# Patient Record
Sex: Female | Born: 1940 | Race: White | Hispanic: No | Marital: Single | Smoking: Never smoker
Health system: Southern US, Community
[De-identification: ages and names within clinical notes are randomized; demographics above are authoritative.]

## PROBLEM LIST (undated history)

## (undated) DIAGNOSIS — R112 Nausea with vomiting, unspecified: Secondary | ICD-10-CM

## (undated) DIAGNOSIS — J45909 Unspecified asthma, uncomplicated: Secondary | ICD-10-CM

## (undated) DIAGNOSIS — F329 Major depressive disorder, single episode, unspecified: Secondary | ICD-10-CM

## (undated) DIAGNOSIS — I10 Essential (primary) hypertension: Secondary | ICD-10-CM

## (undated) DIAGNOSIS — Z9889 Other specified postprocedural states: Secondary | ICD-10-CM

## (undated) DIAGNOSIS — F32A Depression, unspecified: Secondary | ICD-10-CM

## (undated) DIAGNOSIS — E7800 Pure hypercholesterolemia, unspecified: Secondary | ICD-10-CM

## (undated) DIAGNOSIS — Z923 Personal history of irradiation: Secondary | ICD-10-CM

## (undated) DIAGNOSIS — K219 Gastro-esophageal reflux disease without esophagitis: Secondary | ICD-10-CM

## (undated) DIAGNOSIS — M1990 Unspecified osteoarthritis, unspecified site: Secondary | ICD-10-CM

## (undated) DIAGNOSIS — F419 Anxiety disorder, unspecified: Secondary | ICD-10-CM

## (undated) DIAGNOSIS — T8859XA Other complications of anesthesia, initial encounter: Secondary | ICD-10-CM

## (undated) DIAGNOSIS — T4145XA Adverse effect of unspecified anesthetic, initial encounter: Secondary | ICD-10-CM

## (undated) DIAGNOSIS — I517 Cardiomegaly: Secondary | ICD-10-CM

## (undated) HISTORY — PX: CHOLECYSTECTOMY: SHX55

## (undated) HISTORY — DX: Cardiomegaly: I51.7

## (undated) HISTORY — PX: BREAST LUMPECTOMY: SHX2

## (undated) HISTORY — PX: TONSILECTOMY/ADENOIDECTOMY WITH MYRINGOTOMY: SHX6125

## (undated) HISTORY — DX: Pure hypercholesterolemia, unspecified: E78.00

## (undated) HISTORY — DX: Personal history of irradiation: Z92.3

## (undated) HISTORY — PX: APPENDECTOMY: SHX54

## (undated) HISTORY — PX: PARTIAL HYSTERECTOMY: SHX80

## (undated) HISTORY — PX: TONSILLECTOMY: SUR1361

## (undated) HISTORY — PX: THYROIDECTOMY, PARTIAL: SHX18

## (undated) HISTORY — PX: BREAST SURGERY: SHX581

## (undated) HISTORY — PX: ABDOMINAL HYSTERECTOMY: SHX81

## (undated) HISTORY — PX: EYE SURGERY: SHX253

---

## 2004-04-07 ENCOUNTER — Ambulatory Visit: Payer: Self-pay | Admitting: Ophthalmology

## 2016-04-21 ENCOUNTER — Other Ambulatory Visit: Payer: Self-pay | Admitting: Family

## 2016-04-21 DIAGNOSIS — N63 Unspecified lump in unspecified breast: Secondary | ICD-10-CM

## 2016-04-22 ENCOUNTER — Other Ambulatory Visit: Payer: Self-pay | Admitting: Family

## 2016-04-22 DIAGNOSIS — N63 Unspecified lump in unspecified breast: Secondary | ICD-10-CM

## 2016-04-29 ENCOUNTER — Ambulatory Visit
Admission: RE | Admit: 2016-04-29 | Discharge: 2016-04-29 | Disposition: A | Discharge status: Home / Self Care | Payer: Medicare Other | Source: Ambulatory Visit | Attending: Family | Admitting: Family

## 2016-04-29 DIAGNOSIS — N63 Unspecified lump in unspecified breast: Secondary | ICD-10-CM

## 2016-05-03 ENCOUNTER — Telehealth: Payer: Self-pay | Admitting: *Deleted

## 2016-05-03 NOTE — Telephone Encounter (Signed)
Confirmed [facility] for 05/05/16 at 1215 .  Instructions and contact information given.

## 2016-05-04 ENCOUNTER — Other Ambulatory Visit: Payer: Self-pay | Admitting: *Deleted

## 2016-05-04 ENCOUNTER — Encounter: Payer: Self-pay | Admitting: *Deleted

## 2016-05-04 DIAGNOSIS — Z170 Estrogen receptor positive status [ER+]: Secondary | ICD-10-CM

## 2016-05-04 DIAGNOSIS — C50312 Malignant neoplasm of lower-inner quadrant of left female breast: Secondary | ICD-10-CM

## 2016-05-05 ENCOUNTER — Encounter: Payer: Self-pay | Admitting: Oncology

## 2016-05-05 ENCOUNTER — Ambulatory Visit: Payer: Medicare Other | Attending: General Surgery | Admitting: Physical Therapy

## 2016-05-05 ENCOUNTER — Encounter: Payer: Self-pay | Admitting: Physical Therapy

## 2016-05-05 ENCOUNTER — Other Ambulatory Visit (HOSPITAL_BASED_OUTPATIENT_CLINIC_OR_DEPARTMENT_OTHER): Payer: Medicare Other

## 2016-05-05 ENCOUNTER — Ambulatory Visit (HOSPITAL_BASED_OUTPATIENT_CLINIC_OR_DEPARTMENT_OTHER): Payer: Medicare Other | Admitting: Oncology

## 2016-05-05 ENCOUNTER — Ambulatory Visit: Payer: Self-pay | Admitting: General Surgery

## 2016-05-05 ENCOUNTER — Ambulatory Visit
Admission: RE | Admit: 2016-05-05 | Discharge: 2016-05-05 | Disposition: A | Discharge status: Home / Self Care | Payer: Medicare Other | Source: Ambulatory Visit | Attending: Radiation Oncology | Admitting: Radiation Oncology

## 2016-05-05 VITALS — BP 153/73 | HR 92 | Temp 97.9°F | Resp 17 | Ht 64.0 in | Wt 159.4 lb

## 2016-05-05 DIAGNOSIS — J45909 Unspecified asthma, uncomplicated: Secondary | ICD-10-CM | POA: Insufficient documentation

## 2016-05-05 DIAGNOSIS — I10 Essential (primary) hypertension: Secondary | ICD-10-CM | POA: Insufficient documentation

## 2016-05-05 DIAGNOSIS — Z79899 Other long term (current) drug therapy: Secondary | ICD-10-CM | POA: Insufficient documentation

## 2016-05-05 DIAGNOSIS — C50312 Malignant neoplasm of lower-inner quadrant of left female breast: Secondary | ICD-10-CM | POA: Diagnosis not present

## 2016-05-05 DIAGNOSIS — E7800 Pure hypercholesterolemia, unspecified: Secondary | ICD-10-CM | POA: Insufficient documentation

## 2016-05-05 DIAGNOSIS — Z170 Estrogen receptor positive status [ER+]: Secondary | ICD-10-CM

## 2016-05-05 DIAGNOSIS — F329 Major depressive disorder, single episode, unspecified: Secondary | ICD-10-CM | POA: Insufficient documentation

## 2016-05-05 DIAGNOSIS — C50512 Malignant neoplasm of lower-outer quadrant of left female breast: Secondary | ICD-10-CM

## 2016-05-05 DIAGNOSIS — F419 Anxiety disorder, unspecified: Secondary | ICD-10-CM | POA: Insufficient documentation

## 2016-05-05 DIAGNOSIS — R293 Abnormal posture: Secondary | ICD-10-CM | POA: Diagnosis present

## 2016-05-05 DIAGNOSIS — Z803 Family history of malignant neoplasm of breast: Secondary | ICD-10-CM | POA: Insufficient documentation

## 2016-05-05 DIAGNOSIS — K219 Gastro-esophageal reflux disease without esophagitis: Secondary | ICD-10-CM | POA: Insufficient documentation

## 2016-05-05 LAB — COMPREHENSIVE METABOLIC PANEL
ALT: 29 U/L (ref 0–55)
ANION GAP: 8 meq/L (ref 3–11)
AST: 22 U/L (ref 5–34)
Albumin: 4 g/dL (ref 3.5–5.0)
Alkaline Phosphatase: 75 U/L (ref 40–150)
BILIRUBIN TOTAL: 0.29 mg/dL (ref 0.20–1.20)
BUN: 16.4 mg/dL (ref 7.0–26.0)
CHLORIDE: 107 meq/L (ref 98–109)
CO2: 27 meq/L (ref 22–29)
Calcium: 9.5 mg/dL (ref 8.4–10.4)
Creatinine: 1 mg/dL (ref 0.6–1.1)
EGFR: 58 mL/min/{1.73_m2} — AB (ref >90)
Glucose: 123 mg/dl (ref 70–140)
POTASSIUM: 4 meq/L (ref 3.5–5.1)
Sodium: 142 mEq/L (ref 136–145)
Total Protein: 7.1 g/dL (ref 6.4–8.3)

## 2016-05-05 LAB — CBC WITH DIFFERENTIAL/PLATELET
BASO%: 0.4 % (ref 0.0–2.0)
Basophils Absolute: 0 10*3/uL (ref 0.0–0.1)
EOS ABS: 0.1 10*3/uL (ref 0.0–0.5)
EOS%: 1.6 % (ref 0.0–7.0)
HEMATOCRIT: 38.2 % (ref 34.8–46.6)
HEMOGLOBIN: 12.9 g/dL (ref 11.6–15.9)
LYMPH#: 2.5 10*3/uL (ref 0.9–3.3)
LYMPH%: 36 % (ref 14.0–49.7)
MCH: 31.3 pg (ref 25.1–34.0)
MCHC: 33.7 g/dL (ref 31.5–36.0)
MCV: 93 fL (ref 79.5–101.0)
MONO#: 0.6 10*3/uL (ref 0.1–0.9)
MONO%: 8.5 % (ref 0.0–14.0)
NEUT%: 53.5 % (ref 38.4–76.8)
NEUTROS ABS: 3.7 10*3/uL (ref 1.5–6.5)
PLATELETS: 169 10*3/uL (ref 145–400)
RBC: 4.11 10*6/uL (ref 3.70–5.45)
RDW: 13.3 % (ref 11.2–14.5)
WBC: 7 10*3/uL (ref 3.9–10.3)

## 2016-05-05 MED ORDER — TAMOXIFEN CITRATE 20 MG PO TABS: 20.0000 mg | ORAL_TABLET | Freq: Every day | ORAL | 12 refills | Status: DC

## 2016-05-05 NOTE — Patient Instructions (Signed)

## 2016-05-05 NOTE — Progress Notes (Signed)
[facility]ne Thompson  [phone] [phone]) [phone]     ID: Jodine Muchmore DOB: Oct 28, 1940  MR#: [id number]  OFH#:[id number]  Patient Care Team: Gennette Pac, NP as PCP - General (Internal Medicine) Chauncey Cruel, MD as Consulting Physician (Oncology) Autumn Messing III, MD as Consulting Physician (General Surgery) Gery Pray, MD as Consulting Physician (Radiation Oncology) Chauncey Cruel, MD OTHER MD:  CHIEF COMPLAINT: Estrogen receptor positive breast cancer  CURRENT TREATMENT: Awaiting definitive therapy   BREAST CANCER HISTORY: Taqwa had routine screening mammography 04/05/2016 showing a possible mass in the left breast. She was brought back for left diagnostic mammography and left breast ultrasonography 04/20/2016. The breast density was category C. There was a spiculated mass in the lower inner portion of the left breast. On physical exam this was not palpable. Targeted ultrasound did confirm an irregular mass at the 6:15 o'clock location of the left breast 3 cm from the nipple, measuring 1.7 cm maximally.  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  Her subsequent history is as detailed below.  INTERVAL HISTORY: Gene was evaluated in the multidisciplinary breast cancer clinic accompanied by her daughter in law Nira Conn and her friend Stanton Kidney. Her case was also presented in the multidisciplinary breast cancer conference that same morning. At that time a preliminary plan was proposed: Breast conserving surgery with sentinel lymph node sampling, consideration of Oncotype, consideration of genetics, and if the patient agrees to anti-estrogen therapy, possibly no adjuvant radiation.  REVIEW OF  SYSTEMS: The patient complains of mild to moderate fatigue, which is not a new problem. She has pain in the back and abdomen and some leg cramps. His sinus problems. She is hoarse. She has a history of irregular heartbeat which has been evaluated by cardiology with stress test, which was negative. She has ankle swelling at times. She has chest wall pain at time, which is not new or more intense or persistent than before. She keeps a dry cough this time of year. She says her appetite is poor and she has a history of ulcers. Sometimes she has bright red blood per rectum which she attributes to hemorrhoids. He has stress urinary incontinence. She bruises easily. She feels forgetful but not anxious or depressed. A detailed review of systems today was otherwise stable.  PAST MEDICAL HISTORY: Past Medical History:  Diagnosis Date  . Enlarged heart   . High cholesterol     PAST SURGICAL HISTORY: Past Surgical History:  Procedure Laterality Date  . APPENDECTOMY    . CHOLECYSTECTOMY    . PARTIAL HYSTERECTOMY    . THYROIDECTOMY, PARTIAL    . TONSILECTOMY/ADENOIDECTOMY WITH MYRINGOTOMY      FAMILY HISTORY Family History  Problem Relation Age of Onset  . Breast cancer Mother   . Colon cancer Brother   . Breast cancer Maternal Grandfather   . Breast cancer Paternal Grandmother   The patient's father died from myocardial infarction at age 35 in the setting of tobacco abuse. The patient's mother died at age 51. She had been diagnosed with breast cancer at age 46 and again at age 36. The patient had one brother, no sisters. The brother had colon cancer diagnosed at age 75. A maternal grandmother was diagnosed with breast cancer in her 52s.  A paternal grandmother was diagnosed with breast cancer as well as well as 2 paternal aunts and in addition one paternal cousin was diagnosed with ovarian cancer. The patient herself has been tested for the BRCA mutation in was negative  GYNECOLOGIC HISTORY:  No LMP  recorded. Menarche age 29, first live birth age 68, she is [place] P2. She underwent hysterectomy with unilateral salpingo-oophorectomy age 17. She did not use hormone replacement. She did use oral contraceptives remotely for about 4 years.  SOCIAL HISTORY:  She worked as a Pharmacist, hospital, chiefly in reading, and still works part-time. She is divorced, lives alone with her dog appendectomy. Son today what cans lives in [place] and works in Press photographer. Son Sachi Boulay lives in [place] and is vice Radio producer and service. The patient has 4 grandchildren by a logically and 2 step grandchildren. She is a [place]    ADVANCED DIRECTIVES: She has the advanced directive papers but has not yet completed or notarize them, as of the 05/05/2016 visit   HEALTH MAINTENANCE: Social History  Substance Use Topics  . Smoking status: Never Smoker  . Smokeless tobacco: Not on file  . Alcohol use No     Colonoscopy:  PAP:  Bone density:   Not on File  Current Outpatient Prescriptions  Medication Sig Dispense Refill  . albuterol (PROVENTIL HFA;VENTOLIN HFA) 108 (90 Base) MCG/ACT inhaler Inhale 2 puffs into the lungs every 4 (four) hours as needed for wheezing or shortness of breath.    Marland Kitchen alendronate-cholecalciferol (FOSAMAX PLUS D) 70-5600 MG-UNIT tablet Take 1 tablet by mouth every 7 (seven) days. Take with a full glass of water on an empty stomach.    Marland Kitchen atorvastatin (LIPITOR) 10 MG tablet Take 10 mg by mouth daily.    . clobetasol ointment (TEMOVATE) 2.69 % Apply 1 application topically 2 (two) times daily.    . enalapril (VASOTEC) 2.5 MG tablet Take 2.5 mg by mouth daily.    Marland Kitchen esomeprazole (NEXIUM) 40 MG capsule Take 40 mg by mouth daily at 12 noon.    . estrogens, conjugated, (PREMARIN) 0.625 MG tablet Take 0.625 mg by mouth 2 (two) times a week. Take daily for 21 days then do not take for 7 days.    Marland Kitchen sertraline (ZOLOFT) 100 MG tablet Take 100 mg by mouth daily.     No current  facility-administered medications for this visit.     OBJECTIVE: Older white woman who appears stated age 76:   05/05/16 1308  BP: (!) 153/73  Pulse: 92  Resp: 17  Temp: 97.9 F (36.6 C)     Body mass index is 27.36 kg/m.    ECOG FS:1 - Symptomatic but completely ambulatory  Ocular: Sclerae unicteric, pupils equal, round and reactive to light Ear-nose-throat: Oropharynx clear and moist Lymphatic: No cervical or supraclavicular adenopathy Lungs no rales or rhonchi, good excursion bilaterally Heart regular rate and rhythm, no murmur appreciated Abd soft, nontender, positive bowel sounds MSK no focal spinal tenderness, no joint edema Neuro: non-focal, well-oriented, appropriate affect Breasts: The right breast is unremarkable. The left breast is status post recent biopsy. There is a moderate ecchymosis. I do not palpate a well-defined mass. Both axillae are benign.   LAB RESULTS:  CMP     Component Value Date/Time   NA 142 05/05/2016 1208   K 4.0 05/05/2016 1208   CO2 27 05/05/2016 1208   GLUCOSE 123 05/05/2016 1208   BUN 16.4 05/05/2016 1208   CREATININE 1.0 05/05/2016 1208  CALCIUM 9.5 05/05/2016 1208   PROT 7.1 05/05/2016 1208   ALBUMIN 4.0 05/05/2016 1208   AST 22 05/05/2016 1208   ALT 29 05/05/2016 1208   ALKPHOS 75 05/05/2016 1208   BILITOT 0.29 05/05/2016 1208    No results found for: TOTALPROTELP, ALBUMINELP, A1GS, A2GS, BETS, BETA2SER, GAMS, MSPIKE, SPEI  No results found for: Nils Pyle, [facility]  Lab Results  Component Value Date   WBC 7.0 05/05/2016   NEUTROABS 3.7 05/05/2016   HGB 12.9 05/05/2016   HCT 38.2 05/05/2016   MCV 93.0 05/05/2016   PLT 169 05/05/2016      Chemistry      Component Value Date/Time   NA 142 05/05/2016 1208   K 4.0 05/05/2016 1208   CO2 27 05/05/2016 1208   BUN 16.4 05/05/2016 1208   CREATININE 1.0 05/05/2016 1208      Component Value Date/Time   CALCIUM 9.5 05/05/2016 1208   ALKPHOS 75  05/05/2016 1208   AST 22 05/05/2016 1208   ALT 29 05/05/2016 1208   BILITOT 0.29 05/05/2016 1208       No results found for: LABCA2  No components found for: [id number]  No results for input(s): INR in the last 168 hours.  Urinalysis No results found for: COLORURINE, APPEARANCEUR, LABSPEC, PHURINE, GLUCOSEU, HGBUR, BILIRUBINUR, KETONESUR, PROTEINUR, UROBILINOGEN, NITRITE, LEUKOCYTESUR   STUDIES: Mm Clip Placement Left  Result Date: 04/29/2016 CLINICAL DATA:  Status post ultrasound-guided core biopsy of a left breast mass at the 6:30 o'clock axis EXAM: DIAGNOSTIC LEFT MAMMOGRAM POST ULTRASOUND BIOPSY COMPARISON:  Previous exam(s). FINDINGS: Mammographic images were obtained following ultrasound guided biopsy of the irregular mass within the left breast at the 6:30 o'clock axis. At the conclusion of the procedure, a ribbon shaped tissue marker was placed at the biopsy site. Biopsy clip is appropriately positioned at the posterior margin of the targeted mass. IMPRESSION: Ribbon shaped biopsy clip appropriately positioned at the posterior margin of the targeted right breast mass at the 6:30 o'clock axis. Final Assessment: Post Procedure Mammograms for Marker Placement Electronically Signed   By: Franki Cabot M.D.   On: 04/29/2016 13:35   [place] Lt Breast Bx W Loc Dev 1st Lesion Img Bx Spec US Guide  Addendum Date: 04/30/2016   ADDENDUM REPORT: 04/30/2016 13:56 ADDENDUM: Pathology revealed grade II invasive ductal carcinoma, ductal carcinoma in situ with microcalcifications and a papillary lesion in the left breast. This was found to be concordant by Dr. Franki Cabot. Pathology results were discussed with the patient by telephone. The patient reported doing well after the biopsy with tenderness at the site. Post biopsy instructions and care were reviewed and questions were answered. The patient was encouraged to call [facility] for any additional concerns. The patient was  referred to the [facility] at the [facility] on May 05, 2016. Pathology results reported by Susa Raring RN, BSN on 04/30/2016. Electronically Signed   By: Franki Cabot M.D.   On: 04/30/2016 13:56   Result Date: 04/30/2016 CLINICAL DATA:  Patient with a left breast mass presents today for ultrasound-guided core biopsy. EXAM: ULTRASOUND GUIDED LEFT BREAST CORE NEEDLE BIOPSY COMPARISON:  Previous exam(s). PROCEDURE: I met with the patient and we discussed the procedure of ultrasound-guided biopsy, including benefits and alternatives. We discussed the high likelihood of a successful procedure. We discussed the risks of the procedure including infection, bleeding, tissue injury, clip migration, and inadequate sampling. Informed written consent was given.  The usual time-out protocol was performed immediately prior to the procedure. Lesion quadrant: Lower inner quadrant Using sterile technique and 1% Lidocaine as local anesthetic, under direct ultrasound visualization, a 12 gauge spring-loaded device was used to perform biopsy of the irregular mass in the left breast at the 6:30 o'clock axisusing a lateral approach. At the conclusion of the procedure, a ribbon shaped tissue marker clip was deployed into the biopsy cavity. Follow-up 2-view mammogram was performed and dictated separately. IMPRESSION: Ultrasound-guided biopsy of the left breast mass at the 6:30 o'clock axis. No apparent complications. Electronically Signed: By: Franki Cabot M.D. On: 04/29/2016 13:33    ELIGIBLE FOR AVAILABLE RESEARCH PROTOCOL: no  ASSESSMENT: 76 y.o. BRCA negative [facility] woman status post left breast lower inner quadrant biopsy 04/29/2016 for a clinical pT1c pN0, tage IA s invasive ductal carcinoma,  grade 2, estrogen and progesterone receptor positive, HER-2 nonamplified, with an MIB-1 of 10%.   (1) tamoxifen started neoadjuvantly 05/05/2016  (2) additional genetics testing  pending  (3) definitive surgery to follow depending on genetics test  (4) Oncotype to be obtained from the definitive surgical samples: Chemotherapy not anticipated  (5) adjuvant radiation to follow as appropriate  PLAN: We spent the better part of today's hour-long appointment discussing the biology of breast cancer in general, and the specifics of the patient's tumor in particular. We first reviewed the fact that cancer is not one disease but more than 100 different diseases and that it is important to keep them separate-- otherwise when friends and relatives discuss their own cancer experiences with Jari confusion can result. Similarly we explained that if breast cancer spreads to the bone or liver, the patient would not have bone cancer or liver cancer, but breast cancer in the bone and breast cancer in the liver: one cancer in three places-- not 3 different cancers which otherwise would have to be treated in 3 different ways.  We discussed the difference between local and systemic therapy. In terms of loco-regional treatment, lumpectomy plus radiation is equivalent to mastectomy as far as survival is concerned. For this reason, and because the cosmetic results are generally superior, we recommend breast conserving surgery.   We also noted that in terms of sequencing of treatments, whether systemic therapy or surgery is done first does not affect the ultimate outcome. This is relevant to Elfida's case, since there may be some delay in her definitive surgery while we wait on genetics results and her final surgical decision. She will be protected during this delay by starting antiestrogen's now.   We then discussed the rationale for systemic therapy. There is some risk that this cancer may have already spread to other parts of her body. Patients frequently ask at this point about bone scans, CAT scans and PET scans to find out if they have occult breast cancer somewhere else. The problem is that in  early stage disease we are much more likely to find false positives then true cancers and this would expose the patient to unnecessary procedures as well as unnecessary radiation. Scans cannot answer the question the patient really would like to know, which is whether she has microscopic disease elsewhere in her body. For those reasons we do not recommend them.  Of course we would proceed to aggressive evaluation of any symptoms that might suggest metastatic disease, but that is not the case here.  Next we went over the options for systemic therapy which are anti-estrogens, anti-HER-2 immunotherapy, and chemotherapy. Kasen does not  meet criteria for anti-HER-2 immunotherapy. She is a good candidate for anti-estrogens.  The question of chemotherapy is more complicated. Chemotherapy is most effective in rapidly growing, aggressive tumors. It is much less effective in low-grade, slow growing cancers, like Reneisha 's. For that reason we are going to request an Oncotype from the definitive surgical sample, as suggested by NCCN guidelines. That will help [place] make a definitive decision regarding chemotherapy in this case.  Ayda has been tested for the BRCA1 and 2 genes and she does not carry those mutations, but this was an while back and we are doing broader panels now. She will be retested. If she does carry a deleterious mutation she has not entirely ruled out the possibility of bilateral mastectomies although she understands that intensified screening yields similar survival results.  For that reason it will be prudent to start her on anti-estrogens now. We discussed the difference between anastrozole and tamoxifen and because she very much wants to continue using vaginal estrogen cream, and because she status post hysterectomy, I think she would be a good candidate for tamoxifen. She has a good understanding of the possible toxicities, side effects and complete locations of this agent including the risk of blood  clots. She will start that today. She can then proceed to genetics testing on eventual surgery at leisure.   Aireanna has a good understanding of the overall plan. She agrees with it. She knows the goal of treatment in her case is cure. She will call with any problems that may develop before her next visit here.  Chauncey Cruel, MD   05/05/2016 2:31 PM Medical Oncology and Hematology [facility] [address] [place], [place] [address] Tel. [phone]    Fax. [phone]

## 2016-05-05 NOTE — Therapy (Signed)
[place], [place], [address] Phone: [phone]   Fax:  [phone]  Physical Therapy Evaluation  Patient Details  Name: Jeanne Thompson MRN: [id number] Date of Birth: November 13, 1940 Referring Provider: Dr. Autumn Messing  Encounter Date: 05/05/2016      PT End of Session - 05/05/16 1630    Visit Number 1   Number of Visits 1   PT Start Time 1312   PT Stop Time 1349   PT Time Calculation (min) 37 min   Activity Tolerance Patient tolerated treatment well   Behavior During Therapy [facility] for tasks assessed/performed      Past Medical History:  Diagnosis Date  . Enlarged heart   . High cholesterol     Past Surgical History:  Procedure Laterality Date  . APPENDECTOMY    . CHOLECYSTECTOMY    . PARTIAL HYSTERECTOMY    . THYROIDECTOMY, PARTIAL    . TONSILECTOMY/ADENOIDECTOMY WITH MYRINGOTOMY      There were no vitals filed for this visit.       Subjective Assessment - 05/05/16 1623    Subjective Patient reports she is here to be seen by her medical team for her newly diagnosed left breast cancer.   Patient is accompained by: Family member   Pertinent History Patient was diagnosed on 04/05/16 with left invasive ductal carcinoma breast cancer. It measures 1.7 cm and is located in the lower inner quadrant. It is ER/PR positive and HER2 negative with a Ki67 of 10%.    Patient Stated Goals reduce lymphedema risk and learn post op shoulder ROM HEP   Currently in Pain? No/denies            [facility] PT Assessment - 05/05/16 0001      Assessment   Medical Diagnosis Left breast cancer   Referring Provider Dr. Autumn Messing   Onset Date/Surgical Date 04/05/16   Hand Dominance Right   Prior Therapy none     Precautions   Precautions Other (comment)   Precaution Comments active cancer     Restrictions   Weight Bearing Restrictions No     Balance Screen   Has the patient fallen in the past 6 months Yes   Encouraged pt to seek PT near her home for balance   How many times? 3  She reports toe numbness and unsteady gait x6 months   Has the patient had a decrease in activity level because of a fear of falling?  No   Is the patient reluctant to leave their home because of a fear of falling?  No     Home Environment   Living Environment Private residence   Living Arrangements Spouse/significant other  59 y.o. 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   Leisure She does not exercise     Cognition   Overall Cognitive Status Within Functional Limits for tasks assessed     Posture/Postural Control   Posture/Postural Control Postural limitations   Postural Limitations Rounded Shoulders;Forward head;Increased thoracic kyphosis     ROM / Strength   AROM / PROM / Strength AROM;Strength     AROM   AROM Assessment Site Shoulder;Cervical   Right/Left Shoulder Right;Left   Right Shoulder Extension 60 Degrees   Right Shoulder Flexion 152 Degrees   Right Shoulder ABduction 170 Degrees  Right Shoulder Internal Rotation 68 Degrees   Right Shoulder External Rotation 77 Degrees   Left Shoulder Extension 63 Degrees   Left Shoulder Flexion 134 Degrees   Left Shoulder ABduction 159 Degrees   Left Shoulder Internal Rotation 80 Degrees   Left Shoulder External Rotation 70 Degrees   Cervical Flexion WNL   Cervical Extension WNL   Cervical - Right Side Bend WNL   Cervical - Left Side Bend WNL   Cervical - Right Rotation WNL   Cervical - Left Rotation WNL     Strength   Overall Strength Within functional limits for tasks performed           LYMPHEDEMA/ONCOLOGY QUESTIONNAIRE - 05/05/16 1628      Type   Cancer Type Left breast cancer     Lymphedema Assessments   Lymphedema Assessments Upper extremities     Right Upper Extremity  Lymphedema   10 cm Proximal to Olecranon Process 27.9 cm   Olecranon Process 24.4 cm   10 cm Proximal to Ulnar Styloid Process 23.1 cm   Just Proximal to Ulnar Styloid Process 16.3 cm   Across Hand at [employer] 18.8 cm   At [place] of 2nd Digit 5.9 cm     Left Upper Extremity Lymphedema   10 cm Proximal to Olecranon Process 27.8 cm   Olecranon Process 25.7 cm   10 cm Proximal to Ulnar Styloid Process 22.3 cm   Just Proximal to Ulnar Styloid Process 16.1 cm   Across Hand at [employer] 18 cm   At [place] of 2nd Digit 5.7 cm       Patient was instructed today in a home exercise program today for post op shoulder range of motion. These included active assist shoulder flexion in sitting, scapular retraction, wall walking with shoulder abduction, and hands behind head external rotation.  She was encouraged to do these twice a day, holding 3 seconds and repeating 5 times when permitted by her physician.          PT Education - 05/05/16 1630    Education provided Yes   Education Details Lymphedema risk reduction and post op shoulder ROM HEP   Person(s) Educated Patient   Methods Explanation;Demonstration;Handout   Comprehension Returned demonstration;Verbalized understanding              Breast Clinic Goals - 05/05/16 1633      Patient will be able to verbalize understanding of pertinent lymphedema risk reduction practices relevant to her diagnosis specifically related to skin care.   Time 1   Period Days   Status Achieved     Patient will be able to return demonstrate and/or verbalize understanding of the post-op home exercise program related to regaining shoulder range of motion.   Time 1   Period Days   Status Achieved     Patient will be able to verbalize understanding of the importance of attending the postoperative After Breast Cancer Class for further lymphedema risk reduction education and therapeutic exercise.   Time 1   Period Days   Status Achieved               Plan - 05/05/16 1630    Clinical Impression Statement Patient was diagnosed on 04/05/16 with left invasive ductal carcinoma breast cancer. It measures 1.7 cm and is located in the lower inner quadrant. It is ER/PR positive and HER2 negative with a Ki67 of 10%. Her multidisciplinary medical team met prior to her assessments to determine  a recommended treatment plan. She is planning to have a left lumpectomy and sentinel node biopsy followed by radiation and anti-estrogen therapy. She may benefit from post op PT to regain shoulder ROM and reduce lymphedema risk. Due to her lack of comorbidities her eval is of low complexity.   Rehab Potential Excellent   Clinical Impairments Affecting Rehab Potential None   PT Frequency One time visit   PT Treatment/Interventions Patient/family education;Therapeutic exercise   PT Next Visit Plan Will f/u after surgery to determine PT needs   PT Home Exercise Plan Post op shoulder ROM HEP   Consulted and Agree with Plan of Care Patient;Family member/caregiver   Family Member Consulted Daughter and friend      Patient will benefit from skilled therapeutic intervention in order to improve the following deficits and impairments:  Postural dysfunction, Decreased knowledge of precautions, Pain, Impaired UE functional use, Decreased range of motion  Visit Diagnosis: Carcinoma of lower-inner quadrant of left breast in female, estrogen receptor positive ([place]) - Plan: PT plan of care cert/re-cert  Abnormal posture - Plan: PT plan of care cert/re-cert  Patient will follow up at outpatient cancer rehab if needed following surgery.  If the patient requires physical therapy at that time, a specific plan will be dictated and sent to the referring physician for approval. The patient was educated today on appropriate basic range of motion exercises to begin post operatively and the importance of attending the After Breast Cancer class following surgery.  Patient  was educated today on lymphedema risk reduction practices as it pertains to recommendations that will benefit the patient immediately following surgery.  She verbalized good understanding.  No additional physical therapy is indicated at this time.        G-Codes - 05-30-16 1633    Functional Assessment Tool Used (Outpatient Only) Clinical Judgement   Functional Limitation Other PT primary   Other PT Primary Current Status (F7588) At least 1 percent but less than 20 percent impaired, limited or restricted   Other PT Primary Goal Status (T2549) At least 1 percent but less than 20 percent impaired, limited or restricted   Other PT Primary Discharge Status (I2641) At least 1 percent but less than 20 percent impaired, limited or restricted       Problem List Patient Active Problem List   Diagnosis Date Noted  . Malignant neoplasm of lower-inner quadrant of left breast in female, estrogen receptor positive ([place]) 05/04/2016    Annia Friendly, PT 30-May-2016 4:35 PM  [place], [place], [address] Phone: [phone]   Fax:  [phone]  Name: Jeanne Thompson MRN: [id number] Date of Birth: 09-Mar-1940

## 2016-05-05 NOTE — Progress Notes (Signed)
Radiation Oncology         [phone] ________________________________  Initial Outpatient Consultation  Name: Jeanne Thompson MRN: [id number]  Date: 05/05/2016  DOB: August 08, 1940  NW:GNFAOZH P Carlis Abbott, NP  Jovita Kussmaul, MD   REFERRING PHYSICIAN: Autumn Messing III, MD  DIAGNOSIS:    ICD-9-CM ICD-10-CM   1. Malignant neoplasm of lower-inner quadrant of left breast in female, estrogen receptor positive ([place]) 174.3 C50.312    V86.0 Z17.0    Stage IA (cT1c, cN0, cM0) Left Breast LIQ Invasive Ductal Carcinoma, ER+ / PR+ / Her2-, Grade 2  CHIEF COMPLAINT: Here to discuss management of left breast cancer  HISTORY OF PRESENT ILLNESS::Jeanne Thompson is a 76 y.o. female who presented with left breast mass on screening detected mammogram. Diagnostic mammogram on 04/20/16 showed a 1.7 x 1.1 x 1.6 cm mass 3 cm from the nipple at the 6:15 position. There were two circumscribed nodules at the 5:45 position that are stable and consistent with benign fibroadenomas. Axilla was negative. Biopsy on 04/29/16 showed invasive ductal carcinoma with microcalcifications with characteristics as described above in the diagnosis. Receptor status was ER 100%, PR 100%, HER2-, and Ki67 10%.  Of note, patient has a family history of breast cancer. Her mother was diagnosed at age 39, she also reports her maternal and paternal grandmothers as well as an aunt were diagnosed with breast cancer.  Patient is positive for fatigue, general pain, sinus problems, hoarse voice, irregular heartbeat, feet swelling, chest pain, dry cough, ulcer, hernia, change in stool habits, change in stool color, abdominal pain, rectal bleeding, dribbling urine, pain in breast, lump in breast, breast dimpling, bruise easily, back pain, arthritis, difficulty walking, weakness, numbness, and forgetfulness.  PREVIOUS RADIATION THERAPY: No  PAST MEDICAL HISTORY:  has a past medical history of Enlarged heart and High cholesterol.    PAST SURGICAL  HISTORY: Past Surgical History:  Procedure Laterality Date  . APPENDECTOMY    . CHOLECYSTECTOMY    . PARTIAL HYSTERECTOMY    . THYROIDECTOMY, PARTIAL    . TONSILECTOMY/ADENOIDECTOMY WITH MYRINGOTOMY      FAMILY HISTORY: family history includes Breast cancer in her maternal grandfather, mother, and paternal grandmother; Colon cancer in her brother.  SOCIAL HISTORY:  reports that she has never smoked. She does not have any smokeless tobacco history on file. She reports that she does not drink alcohol or use drugs.  ALLERGIES: Patient has no allergy information on record.  MEDICATIONS:  Current Outpatient Prescriptions  Medication Sig Dispense Refill  . albuterol (PROVENTIL HFA;VENTOLIN HFA) 108 (90 Base) MCG/ACT inhaler Inhale 2 puffs into the lungs every 4 (four) hours as needed for wheezing or shortness of breath.    Marland Kitchen alendronate-cholecalciferol (FOSAMAX PLUS D) 70-5600 MG-UNIT tablet Take 1 tablet by mouth every 7 (seven) days. Take with a full glass of water on an empty stomach.    Marland Kitchen atorvastatin (LIPITOR) 10 MG tablet Take 10 mg by mouth daily.    . clobetasol ointment (TEMOVATE) 0.86 % Apply 1 application topically 2 (two) times daily.    . enalapril (VASOTEC) 2.5 MG tablet Take 2.5 mg by mouth daily.    Marland Kitchen esomeprazole (NEXIUM) 40 MG capsule Take 40 mg by mouth daily at 12 noon.    . estrogens, conjugated, (PREMARIN) 0.625 MG tablet Take 0.625 mg by mouth 2 (two) times a week. Take daily for 21 days then do not take for 7 days.    Marland Kitchen sertraline (ZOLOFT) 100 MG tablet Take 100 mg by  mouth daily.    . tamoxifen (NOLVADEX) 20 MG tablet Take 1 tablet (20 mg total) by mouth daily. 90 tablet 12   No current facility-administered medications for this encounter.     REVIEW OF SYSTEMS: A 10+ POINT REVIEW OF SYSTEMS WAS OBTAINED including neurology, dermatology, psychiatry, cardiac, respiratory, lymph, extremities, GI, GU, Musculoskeletal, constitutional, breasts, reproductive, HEENT.  All  pertinent positives are noted in the HPI.  All others are negative.   PHYSICAL EXAM:  Vitals with BMI 05/05/2016  Height '5\' 4"'   Weight 159 lbs 6 oz  BMI 15.4  Systolic 008  Diastolic 73  Pulse 92  Respirations 17   General: Alert and oriented, in no acute distress HEENT: Head is normocephalic. Extraocular movements are intact. Oropharynx is clear. Neck: Neck is supple, no palpable cervical or supraclavicular lymphadenopathy. Heart: Regular in rate and rhythm with no murmurs, rubs, or gallops. Chest: Clear to auscultation bilaterally, with no rhonchi, wheezes, or rales. Abdomen: Soft, nontender, nondistended, with no rigidity or guarding. Extremities: No cyanosis or edema. Lymphatics: see Neck Exam Skin: No concerning lesions. Musculoskeletal: symmetric strength and muscle tone throughout. Neurologic: Cranial nerves II through XII are grossly intact. No obvious focalities. Speech is fluent. Coordination is intact. Psychiatric: Judgment and insight are intact. Affect is appropriate. Breasts: Left breast the patient has some bruising in the inferior aspect of the breast. No palpable masses appreciated in the breasts or axillae. No nipple discharge, or bleeding in bilateral breasts.    ECOG = 1  0 - Asymptomatic (Fully active, able to carry on all predisease activities without restriction)  1 - Symptomatic but completely ambulatory (Restricted in physically strenuous activity but ambulatory and able to carry out work of a light or sedentary nature. For example, light housework, office work)  2 - Symptomatic, <50% in bed during the day (Ambulatory and capable of all self care but unable to carry out any work activities. Up and about more than 50% of waking hours)  3 - Symptomatic, >50% in bed, but not bedbound (Capable of only limited self-care, confined to bed or chair 50% or more of waking hours)  4 - Bedbound (Completely disabled. Cannot carry on any self-care. Totally confined to  bed or chair)  5 - Death   Eustace Pen MM, Creech RH, Tormey DC, et al. [phone]). "Toxicity and response criteria of the [facility]". [place] Oncol. 5 (6): 649-55   LABORATORY DATA:  Lab Results  Component Value Date   WBC 7.0 05/05/2016   HGB 12.9 05/05/2016   HCT 38.2 05/05/2016   MCV 93.0 05/05/2016   PLT 169 05/05/2016   CMP     Component Value Date/Time   NA 142 05/05/2016 1208   K 4.0 05/05/2016 1208   CO2 27 05/05/2016 1208   GLUCOSE 123 05/05/2016 1208   BUN 16.4 05/05/2016 1208   CREATININE 1.0 05/05/2016 1208   CALCIUM 9.5 05/05/2016 1208   PROT 7.1 05/05/2016 1208   ALBUMIN 4.0 05/05/2016 1208   AST 22 05/05/2016 1208   ALT 29 05/05/2016 1208   ALKPHOS 75 05/05/2016 1208   BILITOT 0.29 05/05/2016 1208         RADIOGRAPHY: Mm Clip Placement Left  Result Date: 04/29/2016 CLINICAL DATA:  Status post ultrasound-guided core biopsy of a left breast mass at the 6:30 o'clock axis EXAM: DIAGNOSTIC LEFT MAMMOGRAM POST ULTRASOUND BIOPSY COMPARISON:  Previous exam(s). FINDINGS: Mammographic images were obtained following ultrasound guided biopsy of the irregular mass within  the left breast at the 6:30 o'clock axis. At the conclusion of the procedure, a ribbon shaped tissue marker was placed at the biopsy site. Biopsy clip is appropriately positioned at the posterior margin of the targeted mass. IMPRESSION: Ribbon shaped biopsy clip appropriately positioned at the posterior margin of the targeted right breast mass at the 6:30 o'clock axis. Final Assessment: Post Procedure Mammograms for Marker Placement Electronically Signed   By: Franki Cabot M.D.   On: 04/29/2016 13:35   [place] Lt Breast Bx W Loc Dev 1st Lesion Img Bx Spec US Guide  Addendum Date: 04/30/2016   ADDENDUM REPORT: 04/30/2016 13:56 ADDENDUM: Pathology revealed grade II invasive ductal carcinoma, ductal carcinoma in situ with microcalcifications and a papillary lesion in the left breast. This was  found to be concordant by Dr. Franki Cabot. Pathology results were discussed with the patient by telephone. The patient reported doing well after the biopsy with tenderness at the site. Post biopsy instructions and care were reviewed and questions were answered. The patient was encouraged to call [facility] for any additional concerns. The patient was referred to the [facility] at the [facility] on May 05, 2016. Pathology results reported by Susa Raring RN, BSN on 04/30/2016. Electronically Signed   By: Franki Cabot M.D.   On: 04/30/2016 13:56   Result Date: 04/30/2016 CLINICAL DATA:  Patient with a left breast mass presents today for ultrasound-guided core biopsy. EXAM: ULTRASOUND GUIDED LEFT BREAST CORE NEEDLE BIOPSY COMPARISON:  Previous exam(s). PROCEDURE: I met with the patient and we discussed the procedure of ultrasound-guided biopsy, including benefits and alternatives. We discussed the high likelihood of a successful procedure. We discussed the risks of the procedure including infection, bleeding, tissue injury, clip migration, and inadequate sampling. Informed written consent was given. The usual time-out protocol was performed immediately prior to the procedure. Lesion quadrant: Lower inner quadrant Using sterile technique and 1% Lidocaine as local anesthetic, under direct ultrasound visualization, a 12 gauge spring-loaded device was used to perform biopsy of the irregular mass in the left breast at the 6:30 o'clock axisusing a lateral approach. At the conclusion of the procedure, a ribbon shaped tissue marker clip was deployed into the biopsy cavity. Follow-up 2-view mammogram was performed and dictated separately. IMPRESSION: Ultrasound-guided biopsy of the left breast mass at the 6:30 o'clock axis. No apparent complications. Electronically Signed: By: Franki Cabot M.D. On: 04/29/2016 13:33      IMPRESSION/PLAN:  Stage IA (cT1c, cN0, cM0) Left Breast LIQ Invasive Ductal Carcinoma, ER+ / PR+ / Her2-, Grade 2. The patient would be a good candidate for breast conservation surgery of the left breast with sentinel lymph node biopsy followed by radiation therapy and Tamoxifen. She will undergo oncotype dx testing to determine the role of chemotherapy. It was recommended the patient consider genetic testing due to her family history but she does not wish to pursue genetic testing at this time. She will discuss this further with Dr.Toth this afternoon.   The patient may be a candidate for excisional biopsy alone avoiding radiation therapy if she is found to have a small tumor with clear margins and she agrees to proceed with adjuvant hormonal therapy  It was a pleasure meeting the patient today. We discussed the risks, benefits, and side effects of radiotherapy. I recommend radiotherapy to the left breast to reduce her risk of locoregional recurrence by 2/3.  We discussed that radiation would take approximately 4-  6.5 weeks to complete and that I would give the patient a few weeks to heal following surgery before starting treatment planning. If chemotherapy were to be given, this would precede radiotherapy. We spoke about acute effects including skin irritation and fatigue as well as much less common late effects including internal organ injury or irritation. We spoke about the latest technology that is used to minimize the risk of late effects for patients undergoing radiotherapy to the breast or chest wall. No guarantees of treatment were given. The patient is enthusiastic about proceeding with treatment. I look forward to participating in the patient's care.  I will await her referral back to me for postoperative follow-up and eventual CT simulation/treatment planning.     __________________________________________  This document serves as a record of services personally performed by Gery Pray, MD. It was created on  his behalf by Bethann Humble, a trained medical scribe. The creation of this record is based on the scribe's personal observations and the provider's statements to them. This document has been checked and approved by the attending provider.

## 2016-05-06 ENCOUNTER — Encounter: Payer: Medicare Other | Admitting: Genetics

## 2016-05-06 ENCOUNTER — Encounter: Payer: Self-pay | Admitting: General Practice

## 2016-05-06 ENCOUNTER — Other Ambulatory Visit: Payer: Medicare Other

## 2016-05-06 NOTE — Progress Notes (Signed)
[place] Psychosocial Distress Screening Spiritual Care  Met with Betania, her DIL, and a close friend in Breast Multidisciplinary Clinic to introduce [facility] team/resources, reviewing distress screen per protocol.  The patient scored a 3 on the Psychosocial Distress Thermometer which indicates mild distress. Also assessed for distress and other psychosocial needs.   ONCBCN DISTRESS SCREENING 05/06/2016  Screening Type Initial Screening  Distress experienced in past week (1-10) 3  Emotional problem type Nervousness/Anxiety  Referral to support programs Yes   Franceska reports relief upon receiving more information at [facility] and feeling her own confidence in her team after meeting everyone. Per pt, her reported nervousness was related primarily to dx and worrying about the possible scope of her disease.  Rabab has good family/friend support. She lives in [place], so we talked about [employer] as a resource to combine with other trips to [place]. She is also aware of team availability by phone.  Follow up needed: No. Per pt, no other needs at this time, but she plans to engage team/programming in the future.   [place], [place], [facility] Pager [phone] Voicemail [phone]

## 2016-05-10 ENCOUNTER — Other Ambulatory Visit: Payer: Self-pay | Admitting: General Surgery

## 2016-05-10 DIAGNOSIS — Z170 Estrogen receptor positive status [ER+]: Principal | ICD-10-CM

## 2016-05-10 DIAGNOSIS — C50512 Malignant neoplasm of lower-outer quadrant of left female breast: Secondary | ICD-10-CM

## 2016-05-11 ENCOUNTER — Telehealth: Payer: Self-pay | Admitting: *Deleted

## 2016-05-11 NOTE — Telephone Encounter (Signed)
  Oncology Nurse Navigator Documentation  Navigator Location: [facility] (05/11/16 1300)   )Navigator Encounter Type: Telephone (05/11/16 1300) Telephone: Outgoing Call;Clinic/MDC Follow-up (05/11/16 1300)     Surgery Date: 05/27/16 (05/11/16 1300)                 Barriers/Navigation Needs: No barriers at this time (05/11/16 1300)                          Time Spent with Patient: 15 (05/11/16 1300)

## 2016-05-19 ENCOUNTER — Encounter (HOSPITAL_BASED_OUTPATIENT_CLINIC_OR_DEPARTMENT_OTHER): Payer: Self-pay | Admitting: *Deleted

## 2016-05-24 ENCOUNTER — Ambulatory Visit
Admission: RE | Admit: 2016-05-24 | Discharge: 2016-05-24 | Disposition: A | Discharge status: Home / Self Care | Payer: Medicare Other | Source: Ambulatory Visit | Attending: General Surgery | Admitting: General Surgery

## 2016-05-24 DIAGNOSIS — C50512 Malignant neoplasm of lower-outer quadrant of left female breast: Secondary | ICD-10-CM

## 2016-05-24 DIAGNOSIS — Z170 Estrogen receptor positive status [ER+]: Principal | ICD-10-CM

## 2016-05-24 NOTE — Progress Notes (Signed)
Boost breeze given with instructions to complete by 08:15 am of surgery. Pt verbalized understanding.

## 2016-05-27 ENCOUNTER — Ambulatory Visit (HOSPITAL_BASED_OUTPATIENT_CLINIC_OR_DEPARTMENT_OTHER)
Admission: RE | Admit: 2016-05-27 | Discharge: 2016-05-27 | Disposition: A | Discharge status: Home / Self Care | Payer: Medicare Other | Source: Ambulatory Visit | Attending: General Surgery | Admitting: General Surgery

## 2016-05-27 ENCOUNTER — Encounter (HOSPITAL_BASED_OUTPATIENT_CLINIC_OR_DEPARTMENT_OTHER): Payer: Self-pay | Admitting: *Deleted

## 2016-05-27 ENCOUNTER — Ambulatory Visit (HOSPITAL_BASED_OUTPATIENT_CLINIC_OR_DEPARTMENT_OTHER): Payer: Medicare Other | Admitting: Anesthesiology

## 2016-05-27 ENCOUNTER — Ambulatory Visit
Admission: RE | Admit: 2016-05-27 | Discharge: 2016-05-27 | Disposition: A | Discharge status: Home / Self Care | Payer: Medicare Other | Source: Ambulatory Visit | Attending: General Surgery | Admitting: General Surgery

## 2016-05-27 ENCOUNTER — Encounter (HOSPITAL_BASED_OUTPATIENT_CLINIC_OR_DEPARTMENT_OTHER)
Admission: RE | Disposition: A | Discharge status: Home / Self Care | Payer: Self-pay | Source: Ambulatory Visit | Attending: General Surgery

## 2016-05-27 ENCOUNTER — Encounter (HOSPITAL_COMMUNITY)
Admission: RE | Admit: 2016-05-27 | Discharge: 2016-05-27 | Disposition: A | Discharge status: Home / Self Care | Payer: Medicare Other | Source: Ambulatory Visit | Attending: General Surgery | Admitting: General Surgery

## 2016-05-27 DIAGNOSIS — Z170 Estrogen receptor positive status [ER+]: Principal | ICD-10-CM

## 2016-05-27 DIAGNOSIS — E7800 Pure hypercholesterolemia, unspecified: Secondary | ICD-10-CM | POA: Diagnosis not present

## 2016-05-27 DIAGNOSIS — K219 Gastro-esophageal reflux disease without esophagitis: Secondary | ICD-10-CM | POA: Insufficient documentation

## 2016-05-27 DIAGNOSIS — F329 Major depressive disorder, single episode, unspecified: Secondary | ICD-10-CM | POA: Diagnosis not present

## 2016-05-27 DIAGNOSIS — C50912 Malignant neoplasm of unspecified site of left female breast: Secondary | ICD-10-CM | POA: Diagnosis present

## 2016-05-27 DIAGNOSIS — C50512 Malignant neoplasm of lower-outer quadrant of left female breast: Secondary | ICD-10-CM | POA: Diagnosis not present

## 2016-05-27 DIAGNOSIS — F419 Anxiety disorder, unspecified: Secondary | ICD-10-CM | POA: Insufficient documentation

## 2016-05-27 DIAGNOSIS — J45909 Unspecified asthma, uncomplicated: Secondary | ICD-10-CM | POA: Insufficient documentation

## 2016-05-27 HISTORY — DX: Depression, unspecified: F32.A

## 2016-05-27 HISTORY — DX: Other specified postprocedural states: Z98.890

## 2016-05-27 HISTORY — DX: Unspecified osteoarthritis, unspecified site: M19.90

## 2016-05-27 HISTORY — DX: Nausea with vomiting, unspecified: R11.2

## 2016-05-27 HISTORY — DX: Unspecified asthma, uncomplicated: J45.909

## 2016-05-27 HISTORY — DX: Gastro-esophageal reflux disease without esophagitis: K21.9

## 2016-05-27 HISTORY — PX: BREAST LUMPECTOMY WITH RADIOACTIVE SEED AND SENTINEL LYMPH NODE BIOPSY: SHX6550

## 2016-05-27 HISTORY — DX: Anxiety disorder, unspecified: F41.9

## 2016-05-27 HISTORY — DX: Essential (primary) hypertension: I10

## 2016-05-27 HISTORY — DX: Major depressive disorder, single episode, unspecified: F32.9

## 2016-05-27 HISTORY — DX: Other complications of anesthesia, initial encounter: T88.59XA

## 2016-05-27 HISTORY — DX: Adverse effect of unspecified anesthetic, initial encounter: T41.45XA

## 2016-05-27 SURGERY — BREAST LUMPECTOMY WITH RADIOACTIVE SEED AND SENTINEL LYMPH NODE BIOPSY
Anesthesia: General | Site: Breast | Laterality: Left

## 2016-05-27 MED ORDER — ONDANSETRON HCL 4 MG/2ML IJ SOLN
INTRAMUSCULAR | Status: AC
  Filled 2016-05-27: qty 2

## 2016-05-27 MED ORDER — FENTANYL CITRATE (PF) 100 MCG/2ML IJ SOLN
25.0000 ug | INTRAMUSCULAR | Status: DC | PRN
  Administered 2016-05-27 (×2): 25 ug via INTRAVENOUS

## 2016-05-27 MED ORDER — BUPIVACAINE-EPINEPHRINE (PF) 0.5% -1:200000 IJ SOLN
INTRAMUSCULAR | Status: DC | PRN
  Administered 2016-05-27: 30 mL via PERINEURAL

## 2016-05-27 MED ORDER — EPHEDRINE 5 MG/ML INJ
INTRAVENOUS | Status: AC
Start: 2016-05-27 — End: 2016-05-27
  Filled 2016-05-27: qty 10

## 2016-05-27 MED ORDER — LIDOCAINE 2% (20 MG/ML) 5 ML SYRINGE
INTRAMUSCULAR | Status: DC | PRN
  Administered 2016-05-27: 100 mg via INTRAVENOUS

## 2016-05-27 MED ORDER — ONDANSETRON HCL 4 MG/2ML IJ SOLN
INTRAMUSCULAR | Status: DC | PRN
  Administered 2016-05-27: 4 mg via INTRAVENOUS

## 2016-05-27 MED ORDER — PROPOFOL 10 MG/ML IV BOLUS
INTRAVENOUS | Status: AC
  Filled 2016-05-27: qty 20

## 2016-05-27 MED ORDER — FENTANYL CITRATE (PF) 100 MCG/2ML IJ SOLN
INTRAMUSCULAR | Status: AC
  Filled 2016-05-27: qty 2

## 2016-05-27 MED ORDER — GABAPENTIN 300 MG PO CAPS
300.0000 mg | ORAL_CAPSULE | ORAL | Status: AC
  Administered 2016-05-27: 300 mg via ORAL

## 2016-05-27 MED ORDER — METOCLOPRAMIDE HCL 5 MG/ML IJ SOLN
INTRAMUSCULAR | Status: AC
  Filled 2016-05-27: qty 2

## 2016-05-27 MED ORDER — HYDROCODONE-ACETAMINOPHEN 5-325 MG PO TABS
1.0000 | ORAL_TABLET | Freq: Once | ORAL | Status: AC | PRN
  Administered 2016-05-27: 1 via ORAL

## 2016-05-27 MED ORDER — SCOPOLAMINE 1 MG/3DAYS TD PT72: 1.0000 | MEDICATED_PATCH | Freq: Once | TRANSDERMAL | Status: DC | PRN

## 2016-05-27 MED ORDER — CHLORHEXIDINE GLUCONATE CLOTH 2 % EX PADS: 6.0000 | MEDICATED_PAD | Freq: Once | CUTANEOUS | Status: DC

## 2016-05-27 MED ORDER — 0.9 % SODIUM CHLORIDE (POUR BTL) OPTIME
TOPICAL | Status: DC | PRN
  Administered 2016-05-27: 500 mL

## 2016-05-27 MED ORDER — HYDROCODONE-ACETAMINOPHEN 5-325 MG PO TABS: 1.0000 | ORAL_TABLET | ORAL | 0 refills | Status: DC | PRN

## 2016-05-27 MED ORDER — ACETAMINOPHEN 500 MG PO TABS
ORAL_TABLET | ORAL | Status: AC
  Filled 2016-05-27: qty 2

## 2016-05-27 MED ORDER — CEFAZOLIN SODIUM-DEXTROSE 2-4 GM/100ML-% IV SOLN
2.0000 g | INTRAVENOUS | Status: AC
  Administered 2016-05-27: 2 g via INTRAVENOUS

## 2016-05-27 MED ORDER — TECHNETIUM TC 99M SULFUR COLLOID FILTERED: 1.0000 | Freq: Once | INTRAVENOUS | Status: DC | PRN

## 2016-05-27 MED ORDER — MIDAZOLAM HCL 2 MG/2ML IJ SOLN: 1.0000 mg | INTRAMUSCULAR | Status: DC | PRN

## 2016-05-27 MED ORDER — CELECOXIB 200 MG PO CAPS
ORAL_CAPSULE | ORAL | Status: AC
Start: 2016-05-27 — End: 2016-05-27
  Filled 2016-05-27: qty 2

## 2016-05-27 MED ORDER — ONDANSETRON HCL 4 MG/2ML IJ SOLN
INTRAMUSCULAR | Status: AC
Start: 2016-05-27 — End: 2016-05-27
  Filled 2016-05-27: qty 2

## 2016-05-27 MED ORDER — FENTANYL CITRATE (PF) 100 MCG/2ML IJ SOLN
INTRAMUSCULAR | Status: DC | PRN
  Administered 2016-05-27 (×2): 12.5 ug via INTRAVENOUS
  Administered 2016-05-27 (×2): 25 ug via INTRAVENOUS
  Administered 2016-05-27 (×2): 12.5 ug via INTRAVENOUS

## 2016-05-27 MED ORDER — LACTATED RINGERS IV SOLN
INTRAVENOUS | Status: DC
  Administered 2016-05-27 (×2): via INTRAVENOUS

## 2016-05-27 MED ORDER — GABAPENTIN 300 MG PO CAPS
ORAL_CAPSULE | ORAL | Status: AC
  Filled 2016-05-27: qty 1

## 2016-05-27 MED ORDER — CELECOXIB 400 MG PO CAPS
400.0000 mg | ORAL_CAPSULE | ORAL | Status: AC
  Administered 2016-05-27: 400 mg via ORAL

## 2016-05-27 MED ORDER — KETOROLAC TROMETHAMINE 60 MG/2ML IM SOLN
INTRAMUSCULAR | Status: DC | PRN
  Administered 2016-05-27: 30 mg via INTRAMUSCULAR

## 2016-05-27 MED ORDER — HYDROCODONE-ACETAMINOPHEN 5-325 MG PO TABS
ORAL_TABLET | ORAL | Status: AC
Start: 2016-05-27 — End: 2016-05-27
  Filled 2016-05-27: qty 1

## 2016-05-27 MED ORDER — DEXAMETHASONE SODIUM PHOSPHATE 4 MG/ML IJ SOLN
INTRAMUSCULAR | Status: DC | PRN
  Administered 2016-05-27: 10 mg via INTRAVENOUS

## 2016-05-27 MED ORDER — MIDAZOLAM HCL 2 MG/2ML IJ SOLN
INTRAMUSCULAR | Status: AC
  Filled 2016-05-27: qty 2

## 2016-05-27 MED ORDER — EPHEDRINE SULFATE 50 MG/ML IJ SOLN
INTRAMUSCULAR | Status: DC | PRN
  Administered 2016-05-27: 20 mg via INTRAVENOUS
  Administered 2016-05-27: 10 mg via INTRAVENOUS

## 2016-05-27 MED ORDER — ONDANSETRON HCL 4 MG/2ML IJ SOLN
4.0000 mg | Freq: Once | INTRAMUSCULAR | Status: DC | PRN
Start: 2016-05-27 — End: 2016-05-27

## 2016-05-27 MED ORDER — PROPOFOL 10 MG/ML IV BOLUS
INTRAVENOUS | Status: DC | PRN
  Administered 2016-05-27: 150 mg via INTRAVENOUS

## 2016-05-27 MED ORDER — METOCLOPRAMIDE HCL 5 MG/ML IJ SOLN
INTRAMUSCULAR | Status: DC | PRN
  Administered 2016-05-27: 10 mg via INTRAVENOUS

## 2016-05-27 MED ORDER — BUPIVACAINE HCL (PF) 0.25 % IJ SOLN
INTRAMUSCULAR | Status: DC | PRN
  Administered 2016-05-27: 27 mL

## 2016-05-27 MED ORDER — FENTANYL CITRATE (PF) 100 MCG/2ML IJ SOLN
50.0000 ug | INTRAMUSCULAR | Status: DC | PRN
  Administered 2016-05-27 (×2): 50 ug via INTRAVENOUS

## 2016-05-27 MED ORDER — LIDOCAINE 2% (20 MG/ML) 5 ML SYRINGE
INTRAMUSCULAR | Status: AC
  Filled 2016-05-27: qty 5

## 2016-05-27 MED ORDER — DEXAMETHASONE SODIUM PHOSPHATE 10 MG/ML IJ SOLN
INTRAMUSCULAR | Status: AC
  Filled 2016-05-27: qty 1

## 2016-05-27 MED ORDER — ACETAMINOPHEN 500 MG PO TABS
1000.0000 mg | ORAL_TABLET | ORAL | Status: AC
  Administered 2016-05-27: 1000 mg via ORAL

## 2016-05-27 MED ORDER — CEFAZOLIN SODIUM-DEXTROSE 2-4 GM/100ML-% IV SOLN
INTRAVENOUS | Status: AC
  Filled 2016-05-27: qty 100

## 2016-05-27 SURGICAL SUPPLY — 48 items
APPLIER CLIP 9.375 MED OPEN (MISCELLANEOUS) ×3
BLADE SURG 15 STRL LF DISP TIS (BLADE) ×1 IMPLANT
BLADE SURG 15 STRL SS (BLADE) ×2
CANISTER SUC SOCK COL 7IN (MISCELLANEOUS) IMPLANT
CANISTER SUCT 1200ML W/VALVE (MISCELLANEOUS) ×3 IMPLANT
CHLORAPREP W/TINT 26ML (MISCELLANEOUS) ×3 IMPLANT
CLIP APPLIE 9.375 MED OPEN (MISCELLANEOUS) ×1 IMPLANT
COVER BACK TABLE 60X90IN (DRAPES) ×3 IMPLANT
COVER MAYO STAND STRL (DRAPES) ×3 IMPLANT
COVER PROBE W GEL 5X96 (DRAPES) ×3 IMPLANT
DECANTER SPIKE VIAL GLASS SM (MISCELLANEOUS) IMPLANT
DERMABOND ADVANCED (GAUZE/BANDAGES/DRESSINGS) ×2
DERMABOND ADVANCED .7 DNX12 (GAUZE/BANDAGES/DRESSINGS) ×1 IMPLANT
DEVICE DUBIN W/COMP PLATE 8390 (MISCELLANEOUS) ×3 IMPLANT
DRAPE LAPAROSCOPIC ABDOMINAL (DRAPES) ×3 IMPLANT
DRAPE UTILITY XL STRL (DRAPES) ×3 IMPLANT
ELECT COATED BLADE 2.86 ST (ELECTRODE) ×3 IMPLANT
ELECT REM PT RETURN 9FT ADLT (ELECTROSURGICAL) ×3
ELECTRODE REM PT RTRN 9FT ADLT (ELECTROSURGICAL) ×1 IMPLANT
GLOVE BIO SURGEON STRL SZ 6.5 (GLOVE) ×2 IMPLANT
GLOVE BIO SURGEON STRL SZ7.5 (GLOVE) ×3 IMPLANT
GLOVE BIO SURGEONS STRL SZ 6.5 (GLOVE) ×1
GLOVE BIOGEL PI IND STRL 7.0 (GLOVE) ×2 IMPLANT
GLOVE BIOGEL PI INDICATOR 7.0 (GLOVE) ×4
GOWN STRL REUS W/ TWL LRG LVL3 (GOWN DISPOSABLE) ×2 IMPLANT
GOWN STRL REUS W/TWL LRG LVL3 (GOWN DISPOSABLE) ×4
ILLUMINATOR WAVEGUIDE N/F (MISCELLANEOUS) IMPLANT
KIT MARKER MARGIN INK (KITS) ×3 IMPLANT
LIGHT WAVEGUIDE WIDE FLAT (MISCELLANEOUS) IMPLANT
NDL SAFETY ECLIPSE 18X1.5 (NEEDLE) IMPLANT
NEEDLE HYPO 18GX1.5 SHARP (NEEDLE)
NEEDLE HYPO 25X1 1.5 SAFETY (NEEDLE) ×3 IMPLANT
NS IRRIG 1000ML POUR BTL (IV SOLUTION) ×3 IMPLANT
PACK BASIN DAY SURGERY FS (CUSTOM PROCEDURE TRAY) ×3 IMPLANT
PENCIL BUTTON HOLSTER BLD 10FT (ELECTRODE) ×3 IMPLANT
SLEEVE SCD COMPRESS KNEE MED (MISCELLANEOUS) ×3 IMPLANT
SPONGE LAP 18X18 X RAY DECT (DISPOSABLE) ×3 IMPLANT
SUT MON AB 4-0 PC3 18 (SUTURE) ×9 IMPLANT
SUT SILK 2 0 SH (SUTURE) IMPLANT
SUT VIC AB 3-0 54X BRD REEL (SUTURE) ×1 IMPLANT
SUT VIC AB 3-0 BRD 54 (SUTURE) ×2
SUT VICRYL 3-0 CR8 SH (SUTURE) ×6 IMPLANT
SYR CONTROL 10ML LL (SYRINGE) ×3 IMPLANT
TOWEL OR 17X24 6PK STRL BLUE (TOWEL DISPOSABLE) ×3 IMPLANT
TOWEL OR NON WOVEN STRL DISP B (DISPOSABLE) ×3 IMPLANT
TUBE CONNECTING 20'X1/4 (TUBING) ×1
TUBE CONNECTING 20X1/4 (TUBING) ×2 IMPLANT
YANKAUER SUCT BULB TIP NO VENT (SUCTIONS) ×3 IMPLANT

## 2016-05-27 NOTE — Progress Notes (Signed)
Nuc med inj performed by nuc med staff. Pt tol well with additional sedation. VSS. Will call family to bedside and update/provide emotional support.

## 2016-05-27 NOTE — Discharge Instructions (Signed)

## 2016-05-27 NOTE — Interval H&P Note (Signed)
History and Physical Interval Note:  05/27/2016 12:24 PM  Jeanne Thompson  has presented today for surgery, with the diagnosis of LEFT BREAST CANCER  The various methods of treatment have been discussed with the patient and family. After consideration of risks, benefits and other options for treatment, the patient has consented to  Procedure(s): BREAST LUMPECTOMY WITH RADIOACTIVE SEED AND SENTINEL LYMPH NODE BIOPSY (Left) as a surgical intervention .  The patient's history has been reviewed, patient examined, no change in status, stable for surgery.  I have reviewed the patient's chart and labs.  Questions were answered to the patient's satisfaction.     TOTH III,[place]otey Rakes S

## 2016-05-27 NOTE — Transfer of Care (Signed)
Immediate Anesthesia Transfer of Care Note  Patient: Jeanne Thompson  Procedure(s) Performed: Procedure(s) (LRB): BREAST LUMPECTOMY WITH RADIOACTIVE SEED AND SENTINEL LYMPH NODE BIOPSY (Left)  Patient Location: PACU  Anesthesia Type: General  Level of Consciousness: awake, sedated, patient cooperative and responds to stimulation  Airway & Oxygen Therapy: Patient Spontanous Breathing and Patient connected to face mask oxygen  Post-op Assessment: Report given to PACU RN, Post -op Vital signs reviewed and stable and Patient moving all extremities  Post vital signs: Reviewed and stable  Complications: No apparent anesthesia complications

## 2016-05-27 NOTE — Progress Notes (Signed)
Assisted Dr. Turk with left, ultrasound guided, pectoralis block. Side rails up, monitors on throughout procedure. See vital signs in flow sheet. Tolerated Procedure well. 

## 2016-05-27 NOTE — Anesthesia Preprocedure Evaluation (Signed)
Anesthesia Evaluation  Patient identified by MRN, date of birth, ID band Patient awake    Reviewed: Allergy & Precautions, NPO status , Patient's Chart, lab work & pertinent test results  History of Anesthesia Complications (+) PONV and history of anesthetic complications  Airway Mallampati: II  TM Distance: >3 FB Neck ROM: Full    Dental  (+) Teeth Intact, Dental Advisory Given   Pulmonary asthma ,    Pulmonary exam normal breath sounds clear to auscultation       Cardiovascular hypertension, Pt. on medications Normal cardiovascular exam Rhythm:Regular Rate:Normal     Neuro/Psych PSYCHIATRIC DISORDERS Anxiety Depression negative neurological ROS     GI/Hepatic Neg liver ROS, GERD  Medicated,  Endo/Other  negative endocrine ROS  Renal/GU negative Renal ROS     Musculoskeletal  (+) Arthritis ,   Abdominal   Peds  Hematology negative hematology ROS (+)   Anesthesia Other Findings Day of surgery medications reviewed with the patient.  Left breast cancer  Reproductive/Obstetrics                             Anesthesia Physical Anesthesia Plan  ASA: II  Anesthesia Plan: General   Post-op Pain Management:  Regional for Post-op pain   Induction: Intravenous  Airway Management Planned: LMA  Additional Equipment:   Intra-op Plan:   Post-operative Plan: Extubation in OR  Informed Consent: I have reviewed the patients History and Physical, chart, labs and discussed the procedure including the risks, benefits and alternatives for the proposed anesthesia with the patient or authorized representative who has indicated his/her understanding and acceptance.   Dental advisory given  Plan Discussed with: CRNA  Anesthesia Plan Comments: (Risks/benefits of general anesthesia discussed with patient including risk of damage to teeth, lips, gum, and tongue, nausea/vomiting, allergic reactions  to medications, and the possibility of heart attack, stroke and death.  All patient questions answered.  Patient wishes to proceed.)        Anesthesia Quick Evaluation

## 2016-05-27 NOTE — Op Note (Signed)
05/27/2016  2:22 PM  PATIENT:  Jeanne Thompson  76 y.o. female  PRE-OPERATIVE DIAGNOSIS:  LEFT BREAST CANCER  POST-OPERATIVE DIAGNOSIS:  LEFT BREAST CANCER  PROCEDURE:  Procedure(s): BREAST LUMPECTOMY WITH RADIOACTIVE SEED AND DEEP RIGHT AXILLARY SENTINEL LYMPH NODE BIOPSY (Left)  SURGEON:  Surgeon(s) and Role:    * Jovita Kussmaul, MD - Primary  PHYSICIAN ASSISTANT:   ASSISTANTS: none   ANESTHESIA:   local and general  EBL:  Total I/O In: 1000 [I.V.:1000] Out: 50 [Blood:50]  BLOOD ADMINISTERED:none  DRAINS: none   LOCAL MEDICATIONS USED:  MARCAINE     SPECIMEN:  Source of Specimen:  LEFT BREAST TISSUE AND SENTINEL NODES x 2 WITH ADDITIONAL SUPERIOR AND MEDIAL MARGINS  DISPOSITION OF SPECIMEN:  PATHOLOGY  COUNTS:  YES  TOURNIQUET:  * No tourniquets in log *  DICTATION: .Dragon Dictation   After informed consent was obtained the patient was brought to the operating room and placed in the supine position on the operating table. After adequate induction of general anesthesia the patient's left chest, breast, and axillary area were prepped with ChloraPrep, allowed to dry, and draped in usual sterile manner. An appropriate timeout was performed. Previously an I-125 seed was placed in the lower portion of the left breast to mark an area of invasive breast cancer. Earlier in the day the patient underwent injection of 1 mCi of technetium sulfur colloid in the subareolar position on the left. The neoprobe was initially set to technetium. An area of radioactivity was readily identified in the left axilla. This area was infiltrated with quarter percent Marcaine. A transversely oriented incision was made with a 15 blade knife overlying the area of radioactivity. The incision was carried through the skin and subcutaneous tissue sharply with electrocautery until the deep left axillary space was entered. The neoprobe was used to direct blunt hemostat dissection until 2 hot lymph nodes were  identified. Each of these lymph nodes was excised sharply with the electrocautery and the lymphatics were clamped with hemostats, divided, and ligated with 3-0 Vicryl ties. Ex vivo counts on both of these nodes were approximately 400. No other hot or palpable lymph nodes were identified in the left axilla. The deep layer of the wound was then closed with interrupted 3-0 Vicryl stitches. The skin was enclosed with a running 4-0 Monocryl subcuticular stitch. Attention was then turned to the left breast. The neoprobe was set to I-125 in the area of radioactivity was readily identified in the inferior left breast. A radially oriented incision was made with a 15 blade knife overlying the area of radioactivity. The incision was carried through the skin and subcutaneous tissue sharply with electrocautery. The breast tissue was then mobilized circumferentially around the incision. The neoprobe was used to identify the area of radioactivity and a circular portion of breast tissue was excised sharply around the radioactive seed with the electrocautery. The dissection was carried all the way to the chest wall. Once the specimen was removed it was oriented with the appropriate paint colors. A specimen radiograph was obtained that showed the clip and seed to be in the center of the specimen. I did elect to take additional superior and medial margins and these were marked appropriately and sent separately. Next the breast tissue was mobilized from the chest wall circumferentially. The cavity was marked with clips. The wound was irrigated with saline and infiltrated with more quarter percent Marcaine. Hemostasis was achieved using the Bovie electrocautery. The breast tissue was then reapproximated  with layers of interrupted 3-0 Vicryl stitches. The skin was then closed with interrupted 4-0 Monocryl subcuticular stitches. Dermabond dressings were applied. Sterile dressings and a breast binder were also applied. The patient  tolerated the procedure well. At the end of the case all needle sponge counts were correct. The patient was then awakened and taken to recovery in stable condition.  PLAN OF CARE: Discharge to home after PACU  PATIENT DISPOSITION:  PACU - hemodynamically stable.   Delay start of Pharmacological VTE agent (>24hrs) due to surgical blood loss or risk of bleeding: not applicable

## 2016-05-27 NOTE — Anesthesia Procedure Notes (Signed)
Anesthesia Regional Block: Pectoralis block   Pre-Anesthetic Checklist: ,, timeout performed, Correct Patient, Correct Site, Correct Laterality, Correct Procedure, Correct Position, site marked, Risks and benefits discussed,  Surgical consent,  Pre-op evaluation,  At surgeon's request and post-op pain management  Laterality: Left  Prep: chloraprep       Needles:  Injection technique: Single-shot  Needle Type: Echogenic Needle     Needle Length: 9cm  Needle Gauge: 21     Additional Needles:   Procedures: ultrasound guided,,,,,,,,  Narrative:  Start time: 05/27/2016 10:47 AM End time: 05/27/2016 10:52 AM Injection made incrementally with aspirations every 5 mL.  Performed by: Personally  Anesthesiologist: Catalina Gravel  Additional Notes: No pain on injection. No increased resistance to injection. Injection made in 5cc increments.  Good needle visualization.  Patient tolerated procedure well.

## 2016-05-27 NOTE — Anesthesia Procedure Notes (Signed)
Procedure Name: LMA Insertion Date/Time: 05/27/2016 12:45 PM Performed by: Justice Rocher Pre-anesthesia Checklist: Patient identified, Emergency Drugs available, Suction available and Patient being monitored Patient Re-evaluated:Patient Re-evaluated prior to inductionOxygen Delivery Method: Circle system utilized Preoxygenation: Pre-oxygenation with 100% oxygen Intubation Type: IV induction Ventilation: Mask ventilation without difficulty LMA: LMA inserted LMA Size: 4.0 Number of attempts: 1 Airway Equipment and Method: Bite block Placement Confirmation: positive ETCO2 Tube secured with: Tape Dental Injury: Teeth and Oropharynx as per pre-operative assessment

## 2016-05-27 NOTE — H&P (Signed)
Jeanne Thompson  Location: [facility] Patient #: [id number] DOB: 10-27-1940 Undefined / Language: Jeanne Thompson / Race: White Female   History of Present Illness  The patient is a 76 year old female who presents with breast cancer. We are asked to see the patient in consultation by Dr. Jana Hakim to evaluate her for a new left breast cancer. The patient is a 76 year old white female who recently had a screening mammogram. At that time she was found to have a 1.7cm abnormality in the inferior left breast. 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 She noted some mild breast pain but no discharge. She does not smoke. She does have some family history of breast cancer in mom, 2 grandmas, and 2 aunts. She does not some chest tightness and numbness in feet   Past Surgical History Appendectomy  Breast Biopsy  Left. Gallbladder Surgery - Laparoscopic  Thyroid Surgery  Tonsillectomy   Diagnostic Studies History  Colonoscopy  1-5 years ago  Medication History  Medications Reconciled  Social History Alcohol use  Occasional alcohol use. Caffeine use  Coffee, Tea. No drug use  Tobacco use  Never smoker.  Family History  Alcohol Abuse  Mother, Son. Breast Cancer  Mother. Cancer  Brother. Cerebrovascular Accident  Father. Colon Cancer  Brother. Heart Disease  Father. Heart disease in female family member before age 33  Respiratory Condition  Brother.  Other Problems Asthma  Back Pain  Bladder Problems  Chest pain  Cholelithiasis  Gastric Ulcer  Gastroesophageal Reflux Disease  Hemorrhoids  Hypercholesterolemia  Lump In Breast  Migraine Headache  Thyroid Disease     Review of Systems General Present- Fatigue. Not Present- Appetite Loss, Chills, Fever, Night Sweats, Weight Gain and Weight Loss. Skin Present- Dryness. Not Present- Change in Wart/Mole, Hives, Jaundice, New Lesions,  Non-Healing Wounds, Rash and Ulcer. HEENT Present- Hoarseness and Seasonal Allergies. Not Present- Earache, Hearing Loss, Nose Bleed, Oral Ulcers, Ringing in the Ears, Sinus Pain, Sore Throat, Visual Disturbances, Wears glasses/contact lenses and Yellow Eyes. Respiratory Present- Chronic Cough, Difficulty Breathing and Wheezing. Not Present- Bloody sputum and Snoring. Breast Present- Breast Mass, Breast Pain and Skin Changes. Not Present- Nipple Discharge. Cardiovascular Present- Chest Pain, Leg Cramps and Swelling of Extremities. Not Present- Difficulty Breathing Lying Down, Palpitations, Rapid Heart Rate and Shortness of Breath. Gastrointestinal Present- Abdominal Pain, Bloating, Chronic diarrhea, Constipation, Hemorrhoids and Indigestion. Not Present- Bloody Stool, Change in Bowel Habits, Difficulty Swallowing, Excessive gas, Gets full quickly at meals, Nausea, Rectal Pain and Vomiting. Musculoskeletal Present- Back Pain. Not Present- Joint Pain, Joint Stiffness, Muscle Pain, Muscle Weakness and Swelling of Extremities. Neurological Present- Numbness, Tingling and Trouble walking. Not Present- Decreased Memory, Fainting, Headaches, Seizures, Tremor and Weakness. Psychiatric Present- Change in Sleep Pattern. Not Present- Anxiety, Bipolar, Depression, Fearful and Frequent crying. Hematology Present- Easy Bruising. Not Present- Blood Thinners, Excessive bleeding, Gland problems, HIV and Persistent Infections.   Physical Exam General Mental Status-Alert. General Appearance-Consistent with stated age. Hydration-Well hydrated. Voice-Normal.  Head and Neck Head-normocephalic, atraumatic with no lesions or palpable masses. Trachea-midline. Thyroid Gland Characteristics - normal size and consistency.  Eye Eyeball - Bilateral-Extraocular movements intact. Sclera/Conjunctiva - Bilateral-No scleral icterus.  Chest and Lung Exam Chest and lung exam reveals -quiet, even and easy  respiratory effort with no use of accessory muscles and on auscultation, normal breath sounds, no adventitious sounds and normal vocal resonance.  Inspection Chest Wall - Normal. Back - normal.  Breast Note: There is no palpable mass in either breast. There is no palpable axillary, supraclavicular, or cervical lymphadenopathy   Cardiovascular Cardiovascular examination reveals -normal heart sounds, regular rate and rhythm with no murmurs and normal pedal pulses bilaterally.  Abdomen Inspection Inspection of the abdomen reveals - No Hernias. Skin - Scar - no surgical scars. Palpation/Percussion Palpation and Percussion of the abdomen reveal - Soft, Non Tender, No Rebound tenderness, No Rigidity (guarding) and No hepatosplenomegaly. Auscultation Auscultation of the abdomen reveals - Bowel sounds normal.  Neurologic Neurologic evaluation reveals -alert and oriented x 3 with no impairment of recent or remote memory. Mental Status-Normal.  Musculoskeletal Normal Exam - Left-Upper Extremity Strength Normal and Lower Extremity Strength Normal. Normal Exam - Right-Upper Extremity Strength Normal and Lower Extremity Strength Normal.  Lymphatic Head & Neck  General Head & Neck Lymphatics: Bilateral - Description - Normal. Axillary  General Axillary Region: Bilateral - Description - Normal. Tenderness - Non Tender. Femoral & Inguinal  Generalized Femoral & Inguinal Lymphatics: Bilateral - Description - Normal. Tenderness - Non Tender.    Assessment & Plan Eddie Dibbles S. Marlou Starks MD; 05/05/2016 4:04 PM) MALIGNANT NEOPLASM OF LOWER-OUTER QUADRANT OF LEFT BREAST OF FEMALE, ESTROGEN RECEPTOR POSITIVE (C50.512) Impression: The patient appears to have a stage I cancer in the lower left breast. I have talked to her in detail about the different options for treatment and at this point she favors breast conservation. I think this is a reasonable way to treat her breast cancer. she is also a good  candidate for sentinel node mapping. I have discussed with her the risks and benefits of the surgery as well as some of the technical aspects and she understands and wishes to proceed. I will plan for a left breast radioactive seed localized lumpectomy and sentinel node mapping Current Plans Pt Education - Breast cancer: discussed with patient and provided information.

## 2016-05-28 ENCOUNTER — Encounter (HOSPITAL_BASED_OUTPATIENT_CLINIC_OR_DEPARTMENT_OTHER): Payer: Self-pay | Admitting: General Surgery

## 2016-05-28 NOTE — Anesthesia Postprocedure Evaluation (Signed)
Anesthesia Post Note  Patient: Jeanne Thompson  Procedure(s) Performed: Procedure(s) (LRB): BREAST LUMPECTOMY WITH RADIOACTIVE SEED AND SENTINEL LYMPH NODE BIOPSY (Left)  Patient location during evaluation: PACU Anesthesia Type: General Level of consciousness: awake and alert Pain management: pain level controlled Vital Signs Assessment: post-procedure vital signs reviewed and stable Respiratory status: spontaneous breathing, nonlabored ventilation, respiratory function stable and patient connected to nasal cannula oxygen Cardiovascular status: blood pressure returned to baseline and stable Postop Assessment: no signs of nausea or vomiting Anesthetic complications: no       Last Vitals:  Vitals:   05/27/16 1515 05/27/16 1545  BP: 115/61 129/71  Pulse: (!) 108 (!) 101  Resp: 11 16  Temp:  36.5 C    Last Pain:  Vitals:   05/27/16 1545  TempSrc:   PainSc: [place]

## 2016-05-31 NOTE — Addendum Note (Signed)
Addendum  created 05/31/16 [id number] by Travon Crochet, Ernesta Amble, CRNA   Charge Capture section accepted

## 2016-06-02 ENCOUNTER — Telehealth: Payer: Self-pay | Admitting: *Deleted

## 2016-06-02 NOTE — Telephone Encounter (Signed)
Ordered mammaprint per Dr. Jana Hakim.  Faxed requisition to [facility] and confirmed receipt.

## 2016-06-09 ENCOUNTER — Encounter (HOSPITAL_COMMUNITY): Payer: Self-pay

## 2016-06-09 ENCOUNTER — Telehealth: Payer: Self-pay | Admitting: *Deleted

## 2016-06-09 ENCOUNTER — Encounter: Payer: Self-pay | Admitting: Radiation Oncology

## 2016-06-09 NOTE — Telephone Encounter (Signed)
Spoke with patient to inform her of the low risk mammaprint results.  She would like to cancel her appointment with Dr. Jana Hakim on 6/15 and see him after XRT complete.  I will place referral for Dr. Sondra Come.

## 2016-06-10 ENCOUNTER — Other Ambulatory Visit: Payer: Self-pay | Admitting: Oncology

## 2016-06-10 NOTE — Progress Notes (Unsigned)
[place]  [phone] [phone]     ID: Jeanne Thompson DOB: Jan 08, 1941  MR#: [id number]  ZHY#:[id number]  Patient Care Team: Jeanne Pac, NP as PCP - General (Internal Medicine) Jeanne Thompson, Jeanne Dad, MD as Consulting Physician (Oncology) Jeanne Kussmaul, MD as Consulting Physician (General Surgery) Jeanne Pray, MD as Consulting Physician (Radiation Oncology) Jeanne Amsterdam, MD as Referring Physician (Surgery) Jeanne Alexandria, MD as Referring Physician (Cardiology) Jeanne Cruel, MD OTHER MD:  CHIEF COMPLAINT: Estrogen receptor positive breast cancer  CURRENT TREATMENT: Awaiting definitive therapy   BREAST CANCER HISTORY: Jeanne Thompson had routine screening mammography 04/05/2016 showing a possible mass in the left breast. She was brought back for left diagnostic mammography and left breast ultrasonography 04/20/2016. The breast density was category C. There was a spiculated mass in the lower inner portion of the left breast. On physical exam this was not palpable. Targeted ultrasound did confirm an irregular mass at the 6:15 o'clock location of the left breast 3 cm from the nipple, measuring 1.7 cm maximally.  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  Her subsequent history is as detailed below.  INTERVAL HISTORY: Jeanne Thompson was evaluated in the multidisciplinary breast cancer clinic accompanied by her daughter in law Jeanne Thompson and her friend Jeanne Thompson. Her case was also presented in the multidisciplinary breast cancer conference that same morning. At that time a preliminary plan was proposed: Breast conserving surgery with sentinel lymph node sampling, consideration of Oncotype, consideration of  genetics, and if the patient agrees to anti-estrogen therapy, possibly no adjuvant radiation.  REVIEW OF SYSTEMS: The patient complains of mild to moderate fatigue, which is not a new problem. She has pain in the back and abdomen and some leg cramps. His sinus problems. She is hoarse. She has a history of irregular heartbeat which has been evaluated by cardiology with stress test, which was negative. She has ankle swelling at times. She has chest wall pain at time, which is not new or more intense or persistent than before. She keeps a dry cough this time of year. She says her appetite is poor and she has a history of ulcers. Sometimes she has bright red blood per rectum which she attributes to hemorrhoids. He has stress urinary incontinence. She bruises easily. She feels forgetful but not anxious or depressed. A detailed review of systems today was otherwise stable.  PAST MEDICAL HISTORY: Past Medical History:  Diagnosis Date  . Anxiety   . Arthritis    low back  . Asthma   . Complication of anesthesia   . Depression   . Enlarged heart   . GERD (gastroesophageal reflux disease)   . High cholesterol   . Hypertension   . PONV (postoperative nausea and vomiting)     PAST SURGICAL HISTORY: Past Surgical History:  Procedure Laterality Date  . ABDOMINAL HYSTERECTOMY     left 1 ovary  . APPENDECTOMY    . BREAST LUMPECTOMY WITH RADIOACTIVE SEED AND SENTINEL LYMPH NODE BIOPSY Left 05/27/2016   Procedure: BREAST LUMPECTOMY WITH RADIOACTIVE SEED AND SENTINEL LYMPH NODE BIOPSY;  Surgeon: Jeanne Kussmaul, MD;  Location: [place];  Service: General;  Laterality: Left;  . BREAST SURGERY Left    benign  . CHOLECYSTECTOMY    .  EYE SURGERY Left    cataract  . PARTIAL HYSTERECTOMY    . THYROIDECTOMY, PARTIAL    . TONSILECTOMY/ADENOIDECTOMY WITH MYRINGOTOMY    . TONSILLECTOMY      FAMILY HISTORY Family History  Problem Relation Age of Onset  . Breast cancer Mother   . Colon  cancer Brother   . Breast cancer Maternal Grandfather   . Breast cancer Paternal Grandmother   The patient's father died from myocardial infarction at age 36 in the setting of tobacco abuse. The patient's mother died at age 65. She had been diagnosed with breast cancer at age 65 and again at age 42. The patient had one brother, no sisters. The brother had colon cancer diagnosed at age 48. A maternal grandmother was diagnosed with breast cancer in her 44s. A paternal grandmother was diagnosed with breast cancer as well as well as 2 paternal aunts and in addition one paternal cousin was diagnosed with ovarian cancer. The patient herself has been tested for the BRCA mutation in was negative  GYNECOLOGIC HISTORY:  No LMP recorded. Patient has had a hysterectomy. Menarche age 52, first live birth age 64, she is [place] P2. She underwent hysterectomy with unilateral salpingo-oophorectomy age 49. She did not use hormone replacement. She did use oral contraceptives remotely for about 4 years.  SOCIAL HISTORY:  She worked as a Pharmacist, hospital, chiefly in reading, and still works part-time. She is divorced, lives alone with her dog appendectomy. Son today what cans lives in [place] and works in Press photographer. Son Lakyla Biswas lives in [place] and is vice Radio producer and service. The patient has 4 grandchildren by a logically and 2 step grandchildren. She is a [place]    ADVANCED DIRECTIVES: She has the advanced directive papers but has not yet completed or notarize them, as of the 05/05/2016 visit   HEALTH MAINTENANCE: Social History  Substance Use Topics  . Smoking status: Never Smoker  . Smokeless tobacco: Never Used  . Alcohol use Yes     Comment: social     Colonoscopy:  PAP:  Bone density:   No Known Allergies  Current Outpatient Prescriptions  Medication Sig Dispense Refill  . albuterol (PROVENTIL HFA;VENTOLIN HFA) 108 (90 Base) MCG/ACT inhaler Inhale 2 puffs into the lungs every 4  (four) hours as needed for wheezing or shortness of breath.    Marland Kitchen atorvastatin (LIPITOR) 10 MG tablet Take 10 mg by mouth daily.    . budesonide (PULMICORT) 180 MCG/ACT inhaler Inhale into the lungs 2 (two) times daily.    . clobetasol ointment (TEMOVATE) 5.63 % Apply 1 application topically 2 (two) times daily.    . enalapril (VASOTEC) 2.5 MG tablet Take 5 mg by mouth daily.     Marland Kitchen esomeprazole (NEXIUM) 40 MG capsule Take 40 mg by mouth daily at 12 noon.    Marland Kitchen estradiol (ESTRACE) 0.1 MG/GM vaginal cream Place 1 Applicatorful vaginally at bedtime.    Marland Kitchen HYDROcodone-acetaminophen (NORCO/VICODIN) 5-325 MG tablet Take 1-2 tablets by mouth every 4 (four) hours as needed for moderate pain or severe pain. 15 tablet 0  . sertraline (ZOLOFT) 100 MG tablet Take 100 mg by mouth daily.     No current facility-administered medications for this visit.     OBJECTIVE: Older white woman who appears stated age There were no vitals filed for this visit.   There is no height or weight on file to calculate BMI.    ECOG FS:1 - Symptomatic but completely ambulatory  Ocular:  Sclerae unicteric, pupils equal, round and reactive to light Ear-nose-throat: Oropharynx clear and moist Lymphatic: No cervical or supraclavicular adenopathy Lungs no rales or rhonchi, good excursion bilaterally Heart regular rate and rhythm, no murmur appreciated Abd soft, nontender, positive bowel sounds MSK no focal spinal tenderness, no joint edema Neuro: non-focal, well-oriented, appropriate affect Breasts: The right breast is unremarkable. The left breast is status post recent biopsy. There is a moderate ecchymosis. I do not palpate a well-defined mass. Both axillae are benign.   LAB RESULTS:  CMP     Component Value Date/Time   NA 142 05/05/2016 1208   K 4.0 05/05/2016 1208   CO2 27 05/05/2016 1208   GLUCOSE 123 05/05/2016 1208   BUN 16.4 05/05/2016 1208   CREATININE 1.0 05/05/2016 1208   CALCIUM 9.5 05/05/2016 1208   PROT 7.1  05/05/2016 1208   ALBUMIN 4.0 05/05/2016 1208   AST 22 05/05/2016 1208   ALT 29 05/05/2016 1208   ALKPHOS 75 05/05/2016 1208   BILITOT 0.29 05/05/2016 1208    No results found for: TOTALPROTELP, ALBUMINELP, A1GS, A2GS, BETS, BETA2SER, GAMS, MSPIKE, SPEI  No results found for: Nils Pyle, [facility]  Lab Results  Component Value Date   WBC 7.0 05/05/2016   NEUTROABS 3.7 05/05/2016   HGB 12.9 05/05/2016   HCT 38.2 05/05/2016   MCV 93.0 05/05/2016   PLT 169 05/05/2016      Chemistry      Component Value Date/Time   NA 142 05/05/2016 1208   K 4.0 05/05/2016 1208   CO2 27 05/05/2016 1208   BUN 16.4 05/05/2016 1208   CREATININE 1.0 05/05/2016 1208      Component Value Date/Time   CALCIUM 9.5 05/05/2016 1208   ALKPHOS 75 05/05/2016 1208   AST 22 05/05/2016 1208   ALT 29 05/05/2016 1208   BILITOT 0.29 05/05/2016 1208       No results found for: LABCA2  No components found for: IOXBDZ329  No results for input(s): INR in the last 168 hours.  Urinalysis No results found for: COLORURINE, APPEARANCEUR, LABSPEC, PHURINE, GLUCOSEU, HGBUR, BILIRUBINUR, KETONESUR, PROTEINUR, UROBILINOGEN, NITRITE, LEUKOCYTESUR   STUDIES: Mm Breast Surgical Specimen  Result Date: 05/27/2016 CLINICAL DATA:  Post left breast lumpectomy. EXAM: SPECIMEN RADIOGRAPH OF THE LEFT BREAST COMPARISON:  Previous exam(s). FINDINGS: Status post excision of the left breast. The radioactive seed and biopsy marker clip are present, completely intact, and were marked for pathology. IMPRESSION: Specimen radiograph of the left breast. Electronically Signed   By: Fidela Salisbury M.D.   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 EXAM: MAMMOGRAPHIC GUIDED RADIOACTIVE SEED LOCALIZATION OF THE LEFT BREAST COMPARISON:  Previous exam(s). FINDINGS: Patient presents for  radioactive seed localization prior to surgery. I met with the patient and we discussed the procedure of seed localization including benefits and alternatives. We discussed the high likelihood of a successful procedure. We discussed the risks of the procedure including infection, bleeding, tissue injury and further surgery. We discussed the low dose of radioactivity involved in the procedure. Informed, written consent was given. The usual time-out protocol was performed immediately prior to the procedure. Using mammographic guidance, sterile technique, 1% lidocaine and an I-125 radioactive seed, the ribbon shaped clip was localized using a medial to lateral approach. The follow-up mammogram images confirm the seed in the expected location and were marked for Dr. Marlou Starks. Follow-up  survey of the patient confirms presence of the radioactive seed. Order number of I-125 seed:  [id number]. Total activity:  7.510 millicuries  Reference Date: 05/06/2016 The patient tolerated the procedure well and was released from the [facility]. She was given instructions regarding seed removal. IMPRESSION: Radioactive seed localization left breast. No apparent complications. Electronically Signed   By: Lillia Mountain M.D.   On: 05/24/2016 14:46    ELIGIBLE FOR AVAILABLE RESEARCH PROTOCOL: no  ASSESSMENT: 76 y.o. BRCA negative [facility] woman status post left breast lower inner quadrant biopsy 04/29/2016 for a clinical pT1c pN0, tage IA s invasive ductal carcinoma,  grade 2, estrogen and progesterone receptor positive, HER-2 nonamplified, with an MIB-1 of 10%.   (1) tamoxifen started neoadjuvantly 05/05/2016  (2) additional genetics testing pending  (3) definitive surgery to follow depending on genetics test  (4) Oncotype to be obtained from the definitive surgical samples: Chemotherapy not anticipated  (5) adjuvant radiation to follow as appropriate  PLAN: We spent the better part of today's hour-long appointment  discussing the biology of breast cancer in general, and the specifics of the patient's tumor in particular. We first reviewed the fact that cancer is not one disease but more than 100 different diseases and that it is important to keep them separate-- otherwise when friends and relatives discuss their own cancer experiences with Chaselyn confusion can result. Similarly we explained that if breast cancer spreads to the bone or liver, the patient would not have bone cancer or liver cancer, but breast cancer in the bone and breast cancer in the liver: one cancer in three places-- not 3 different cancers which otherwise would have to be treated in 3 different ways.  We discussed the difference between local and systemic therapy. In terms of loco-regional treatment, lumpectomy plus radiation is equivalent to mastectomy as far as survival is concerned. For this reason, and because the cosmetic results are generally superior, we recommend breast conserving surgery.   We also noted that in terms of sequencing of treatments, whether systemic therapy or surgery is done first does not affect the ultimate outcome. This is relevant to Ayan's case, since there may be some delay in her definitive surgery while we wait on genetics results and her final surgical decision. She will be protected during this delay by starting antiestrogen's now.   We then discussed the rationale for systemic therapy. There is some risk that this cancer may have already spread to other parts of her body. Patients frequently ask at this point about bone scans, CAT scans and PET scans to find out if they have occult breast cancer somewhere else. The problem is that in early stage disease we are much more likely to find false positives then true cancers and this would expose the patient to unnecessary procedures as well as unnecessary radiation. Scans cannot answer the question the patient really would like to know, which is whether she has microscopic  disease elsewhere in her body. For those reasons we do not recommend them.  Of course we would proceed to aggressive evaluation of any symptoms that might suggest metastatic disease, but that is not the case here.  Next we went over the options for systemic therapy which are anti-estrogens, anti-HER-2 immunotherapy, and chemotherapy. Audree does not meet criteria for anti-HER-2 immunotherapy. She is a good candidate for anti-estrogens.  The question of chemotherapy is more complicated. Chemotherapy is most effective in rapidly growing, aggressive tumors. It is much less effective in low-grade, slow growing  cancers, like Lua 's. For that reason we are going to request an Oncotype from the definitive surgical sample, as suggested by NCCN guidelines. That will help [place] make a definitive decision regarding chemotherapy in this case.  Simisola has been tested for the BRCA1 and 2 genes and she does not carry those mutations, but this was an while back and we are doing broader panels now. She will be retested. If she does carry a deleterious mutation she has not entirely ruled out the possibility of bilateral mastectomies although she understands that intensified screening yields similar survival results.  For that reason it will be prudent to start her on anti-estrogens now. We discussed the difference between anastrozole and tamoxifen and because she very much wants to continue using vaginal estrogen cream, and because she status post hysterectomy, I think she would be a good candidate for tamoxifen. She has a good understanding of the possible toxicities, side effects and complete locations of this agent including the risk of blood clots. She will start that today. She can then proceed to genetics testing on eventual surgery at leisure.   [place] has a good understanding of the overall plan. She agrees with it. She knows the goal of treatment in her case is cure. She will call with any problems that may develop  before her next visit here.  Jeanne Cruel, MD   06/10/2016 7:23 AM Medical Oncology and Hematology [facility] [address] [place], [place] [address] Tel. [phone]    Fax. [phone]

## 2016-06-10 NOTE — Progress Notes (Signed)
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   Past/Anticipated interventions by surgeon, if any: Dr. Autumn Messing Left Lumpectomy 05/27/2016 and radioactive seed localization 05/24/2016  Past/Anticipated interventions by medical oncology, if any: Started Tamoxifen 05-05-16 Dr Jana Hakim  Lymphedema issues, if any:  None  Pain issues, if any:  None  SAFETY ISSUES:  Prior radiation? None  Pacemaker/ICD? None  Possible current pregnancy? None  Is the patient on methotrexate? None  Current Complaints / other details:  Jeanne Thompson 76 yr old single female.  Records imported from [facility].    Wt Readings from Last 3 Encounters:  06/14/16 156 lb 6.4 oz (70.9 kg)  05/27/16 159 lb (72.1 kg)  05/05/16 159 lb 6.4 oz (72.3 kg)  BP (!) 110/59   Pulse 63   Temp 98.1 F (36.7 C) (Oral)   Resp 18   Ht '5\' 4"'  (1.626 m)   Wt 156 lb 6.4 oz (70.9 kg)   SpO2 98%   BMI 26.85 kg/m

## 2016-06-14 ENCOUNTER — Telehealth: Payer: Self-pay | Admitting: Oncology

## 2016-06-14 ENCOUNTER — Ambulatory Visit
Admission: RE | Admit: 2016-06-14 | Discharge: 2016-06-14 | Disposition: A | Discharge status: Home / Self Care | Payer: Medicare Other | Source: Ambulatory Visit | Attending: Radiation Oncology | Admitting: Radiation Oncology

## 2016-06-14 ENCOUNTER — Encounter: Payer: Self-pay | Admitting: Radiation Oncology

## 2016-06-14 DIAGNOSIS — F419 Anxiety disorder, unspecified: Secondary | ICD-10-CM | POA: Diagnosis not present

## 2016-06-14 DIAGNOSIS — Z803 Family history of malignant neoplasm of breast: Secondary | ICD-10-CM | POA: Diagnosis not present

## 2016-06-14 DIAGNOSIS — Z170 Estrogen receptor positive status [ER+]: Secondary | ICD-10-CM | POA: Diagnosis present

## 2016-06-14 DIAGNOSIS — Z79899 Other long term (current) drug therapy: Secondary | ICD-10-CM | POA: Diagnosis not present

## 2016-06-14 DIAGNOSIS — I10 Essential (primary) hypertension: Secondary | ICD-10-CM | POA: Diagnosis not present

## 2016-06-14 DIAGNOSIS — J45909 Unspecified asthma, uncomplicated: Secondary | ICD-10-CM | POA: Diagnosis not present

## 2016-06-14 DIAGNOSIS — C50312 Malignant neoplasm of lower-inner quadrant of left female breast: Secondary | ICD-10-CM

## 2016-06-14 DIAGNOSIS — F329 Major depressive disorder, single episode, unspecified: Secondary | ICD-10-CM | POA: Diagnosis not present

## 2016-06-14 DIAGNOSIS — K219 Gastro-esophageal reflux disease without esophagitis: Secondary | ICD-10-CM | POA: Diagnosis not present

## 2016-06-14 DIAGNOSIS — E7800 Pure hypercholesterolemia, unspecified: Secondary | ICD-10-CM | POA: Diagnosis not present

## 2016-06-14 NOTE — Telephone Encounter (Signed)
Left a message for patient on her home and cell phone regarding her consult appointment today with Dr. Sondra Come.  Requested a return call.

## 2016-06-14 NOTE — Progress Notes (Signed)
Please see the Nurse Progress Note in the MD Initial Consult Encounter for this patient. 

## 2016-06-14 NOTE — Progress Notes (Signed)
Radiation Oncology         [phone] ________________________________  Re-Evaluation Consultation  Name: Jeanne Thompson MRN: [id number]  Date: 06/14/2016  DOB: [birth year]  OV:ZCHYI, Felipa Evener, NP  Magrinat, Virgie Dad, MD   REFERRING PHYSICIAN: Magrinat, Virgie Dad, MD  DIAGNOSIS:    ICD-9-CM ICD-10-CM   1. 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 female who presented with left breast mass on screening detected mammogram. Diagnostic mammogram on 04/20/16 showed a 1.7 x 1.1 x 1.6 cm mass 3 cm from the nipple at the 6:15 position. There were two circumscribed nodules at the 5:45 position that are stable and consistent with benign fibroadenomas. Axilla was negative. Biopsy on 04/29/16 showed invasive ductal carcinoma with microcalcifications with characteristics as described above in the diagnosis. Receptor status was ER 100%, PR 100%, HER2-, and Ki67 10%.  Of note, patient has a family history of breast cancer. Her mother was diagnosed at age 72, she also reports her maternal and paternal grandmothers as well as an aunt were diagnosed with breast cancer.  The patient presented to multidisciplinary breast clinic on 05/05/16 to discuss treatment options for the management of her disease.  The patient proceeded with a left lumpectomy and sentinel lymph node biopsies on 05/27/16. Left lumpectomy revealed grade 1 IDC measuring 2.4 cm, intermediate grade DCIS, perineural invasion, and the DCIS was broadly less than 0.1 cm to the medial margin Additional excision of the superior margin revealed grade 1 IDC , atypical lobular hyperplasia, and the surgical resection margin was negative for  carcinoma. Additional excision of the medial margin revealed fibrocystic changes with adenosis and calcifications, usual ductal hyperplasia, and no evidence of malignancy. Biopsy of two left axillary sentinel lymph nodes revealed that one lypmh nodes was positive for carcinoma. pT2, pN1a  MammaPrint molecular testing was performed on the metastatic lymph node revealed a low risk of recurrence of 10% in the next 10 years if left untreated. Therefore chemotherapy was not recommended. The patient presents today to further discuss the role of radiation for the management of her disease.  PREVIOUS RADIATION THERAPY: No  PAST MEDICAL HISTORY:  has a past medical history of Anxiety; Arthritis; Asthma; Complication of anesthesia; Depression; Enlarged heart; GERD (gastroesophageal reflux disease); High cholesterol; Hypertension; and PONV (postoperative nausea and vomiting).    PAST SURGICAL HISTORY: Past Surgical History:  Procedure Laterality Date  . ABDOMINAL HYSTERECTOMY     left 1 ovary  . APPENDECTOMY    . BREAST LUMPECTOMY WITH RADIOACTIVE SEED AND SENTINEL LYMPH NODE BIOPSY Left 05/27/2016   Procedure: BREAST LUMPECTOMY WITH RADIOACTIVE SEED AND SENTINEL LYMPH NODE BIOPSY;  Surgeon: Jovita Kussmaul, MD;  Location: [facility];  Service: General;  Laterality: Left;  . BREAST SURGERY Left    benign  . CHOLECYSTECTOMY    . EYE SURGERY Left    cataract  . PARTIAL HYSTERECTOMY    . THYROIDECTOMY, PARTIAL    . TONSILECTOMY/ADENOIDECTOMY WITH MYRINGOTOMY    . TONSILLECTOMY      FAMILY HISTORY: family history includes Breast cancer in her maternal grandfather, mother, and paternal grandmother; Colon cancer in her brother.  SOCIAL HISTORY:  reports that she has never smoked. She has never used smokeless tobacco. She reports that she drinks alcohol. She reports that she does not use drugs.  ALLERGIES: Patient has no known allergies.  MEDICATIONS:  Current Outpatient Prescriptions   Medication Sig Dispense Refill  . albuterol (PROVENTIL HFA;VENTOLIN HFA) 108 (90 Base) MCG/ACT inhaler Inhale 2 puffs into the lungs every 4 (four) hours as needed for wheezing or shortness of breath.    Marland Kitchen atorvastatin (LIPITOR) 10 MG tablet Take 10 mg by mouth daily.    . budesonide (PULMICORT) 180 MCG/ACT inhaler Inhale into the lungs 2 (two) times daily.    . clobetasol ointment (TEMOVATE) 4.08 % Apply 1 application topically 2 (two) times daily.    . enalapril (VASOTEC) 2.5 MG tablet Take 5 mg by mouth daily.     Marland Kitchen esomeprazole (NEXIUM) 40 MG capsule Take 40 mg by mouth daily at 12 noon.    Marland Kitchen estradiol (ESTRACE) 0.1 MG/GM vaginal cream Place 1 Applicatorful vaginally at bedtime.    . sertraline (ZOLOFT) 100 MG tablet Take 100 mg by mouth daily.    . tamoxifen (NOLVADEX) 10 MG tablet Take 10 mg by mouth daily.    Marland Kitchen HYDROcodone-acetaminophen (NORCO/VICODIN) 5-325 MG tablet Take 1-2 tablets by mouth every 4 (four) hours as needed for moderate pain or severe pain. (Patient not taking: Reported on 06/14/2016) 15 tablet 0   No current facility-administered medications for this encounter.     REVIEW OF SYSTEMS: A 10+ POINT REVIEW OF SYSTEMS WAS OBTAINED including neurology, dermatology, psychiatry, cardiac, respiratory, lymph, extremities, GI, GU, Musculoskeletal, constitutional, breasts, reproductive, HEENT.  A complete review of systems is obtained and is otherwise negative.  The patient denies left breast pain or lymphedema of the left arm.   PHYSICAL EXAM:  Vitals:   06/14/16 0858  BP: (!) 110/59  Pulse: 63  Resp: 18  Temp: 98.1 F (36.7 C)  TempSrc: Oral  SpO2: 98%  Weight: 156 lb 6.4 oz (70.9 kg)  Height: '5\' 4"'  (1.626 m)   Lungs are clear to auscultation bilaterally. Heart has regular rate and rhythm. No palpable cervical, supraclavicular, or axillary adenopathy. Breast exam showed the right breast without palpable mass or nipple bleeding/discharge. Left breast shows a scar in the  approximate 6:30 position healing well without drainage or infection. Separate scar in the left axillary area without drainage or infection. No nipple bleeding/discharge of the left breast.  LABORATORY DATA:  Lab Results  Component Value Date   WBC 7.0 05/05/2016   HGB 12.9 05/05/2016   HCT 38.2 05/05/2016   MCV 93.0 05/05/2016   PLT 169 05/05/2016   CMP     Component Value Date/Time   NA 142 05/05/2016 1208   K 4.0 05/05/2016 1208   CO2 27 05/05/2016 1208   GLUCOSE 123 05/05/2016 1208   BUN 16.4 05/05/2016 1208   CREATININE 1.0 05/05/2016 1208   CALCIUM 9.5 05/05/2016 1208   PROT 7.1 05/05/2016 1208   ALBUMIN 4.0 05/05/2016 1208   AST 22 05/05/2016 1208   ALT 29 05/05/2016 1208   ALKPHOS 75 05/05/2016 1208   BILITOT 0.29 05/05/2016 1208         RADIOGRAPHY: Mm Breast Surgical Specimen  Result Date: 05/27/2016 CLINICAL DATA:  Post left breast lumpectomy. EXAM: SPECIMEN RADIOGRAPH OF THE LEFT BREAST COMPARISON:  Previous exam(s). FINDINGS: Status post excision of the left breast. The radioactive seed and biopsy marker clip are present, completely intact, and were marked for pathology. IMPRESSION: Specimen  radiograph of the left breast. Electronically Signed   By: Fidela Salisbury M.D.   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 EXAM: MAMMOGRAPHIC GUIDED RADIOACTIVE SEED LOCALIZATION OF THE LEFT BREAST COMPARISON:  Previous exam(s). FINDINGS: Patient presents for radioactive seed localization prior to surgery. I met with the patient and we discussed the procedure of seed localization including benefits and alternatives. We discussed the high likelihood of a successful procedure. We discussed the risks of the procedure including infection, bleeding, tissue injury and further surgery. We discussed the low dose of radioactivity involved in the  procedure. Informed, written consent was given. The usual time-out protocol was performed immediately prior to the procedure. Using mammographic guidance, sterile technique, 1% lidocaine and an I-125 radioactive seed, the ribbon shaped clip was localized using a medial to lateral approach. The follow-up mammogram images confirm the seed in the expected location and were marked for Dr. Marlou Starks. Follow-up survey of the patient confirms presence of the radioactive seed. Order number of I-125 seed:  [id number]. Total activity:  1.700 millicuries  Reference Date: 05/06/2016 The patient tolerated the procedure well and was released from the [facility]. She was given instructions regarding seed removal. IMPRESSION: Radioactive seed localization left breast. No apparent complications. Electronically Signed   By: Lillia Mountain M.D.   On: 05/24/2016 14:46      IMPRESSION/PLAN: Stage IIB (pT2, pN1a, cM0) Left Breast LIQ Invasive Ductal Carcinoma, ER+ / PR+ / Her2-, Grade 1  The patient was felt to be a good candidate for breast conservation surgery of the left breast with sentinel lymph node biopsy followed by radiation therapy and anti-estrogen therapy. She underwent surgery on 05/27/16 and 1 left axillary lymph node was positive for metastatic carcinoma. MammaPrint molecular testing was performed on the lymph node and her risk of recurrence was low. Therefore chemotherapy was not recommended. It was previously recommended the patient consider genetic testing due to her family history but she did not wish to pursue genetic testing at this time.  I spoke to the patient today regarding her diagnosis and options for treatment. We discussed the equivalence in terms of survival and local failure between mastectomy and breast conservation. We discussed the role of radiation in decreasing local failures in patients who undergo lumpectomy. We discussed the process of simulation and the placement tattoos. We discussed 5 - 5/12  weeks of treatment as an outpatient to the left breast and left axilla and a boost of 5 treatments. The left axilla is to be treated in light of a positive lymph node. We discussed the possibility of asymptomatic lung damage. We discussed the low likelihood of secondary malignancies. We discussed the possible side effects including but not limited to skin redness, fatigue, permanent skin darkening, and breast swelling.  We discussed the use of cardiac sparing with deep inspiration breath hold if needed.  CT simulation is scheduled on 06/28/16 at 11AM and treatment is to start the following week. __________________________________________ -----------------------------------  Blair Promise, PhD, MD  This document serves as a record of services personally performed by Gery Pray, MD. It was created on his behalf by Darcus Austin, a trained medical scribe. The creation of this record is based on the scribe's personal observations and the provider's statements to them. This document has been checked and approved by the attending provider.

## 2016-06-25 ENCOUNTER — Ambulatory Visit: Payer: Medicare Other | Admitting: Oncology

## 2016-06-28 ENCOUNTER — Ambulatory Visit
Admission: RE | Admit: 2016-06-28 | Discharge: 2016-06-28 | Disposition: A | Discharge status: Home / Self Care | Payer: Medicare Other | Source: Ambulatory Visit | Attending: Radiation Oncology | Admitting: Radiation Oncology

## 2016-06-28 DIAGNOSIS — C50312 Malignant neoplasm of lower-inner quadrant of left female breast: Secondary | ICD-10-CM | POA: Diagnosis not present

## 2016-06-28 DIAGNOSIS — Z510 Encounter for antineoplastic radiation therapy: Secondary | ICD-10-CM | POA: Insufficient documentation

## 2016-06-28 DIAGNOSIS — Z170 Estrogen receptor positive status [ER+]: Secondary | ICD-10-CM | POA: Insufficient documentation

## 2016-06-28 NOTE — Progress Notes (Signed)
  Radiation Oncology         [phone] ________________________________  Name: Jeanne Thompson MRN: [id number]  Date: 06/28/2016  DOB: 1940/10/08  SIMULATION AND TREATMENT PLANNING NOTE    ICD-10-CM   1. Malignant neoplasm of lower-inner quadrant of left breast in female, estrogen receptor positive ([place]) C50.312    Z17.0     DIAGNOSIS:  Stage IIB (pT2, pN1a, cM0) Left Breast LIQ Invasive Ductal Carcinoma, ER/PR Positive, Her2 Negative, Grade 1  NARRATIVE:  The patient was brought to the [facility].  Identity was confirmed.  All relevant records and images related to the planned course of therapy were reviewed.  The patient freely provided informed written consent to proceed with treatment after reviewing the details related to the planned course of therapy. The consent form was witnessed and verified by the simulation staff.  Then, the patient was set-up in a stable reproducible  supine position for radiation therapy.  CT images were obtained.  Surface markings were placed.  The CT images were loaded into the planning software.  Then the target and avoidance structures were contoured.  Treatment planning then occurred.  The radiation prescription was entered and confirmed.  Then, I designed and supervised the construction of a total of 5 medically necessary complex treatment devices.  I have requested : 3D Simulation  I have requested a DVH of the following structures: lumpectomy cavity, heart, lungs.  I have ordered:CBC  PLAN:  The patient will receive 50.4 Gy in 28 fractions Directed at the left breast area. The axillary area will receive 45 gray in 25 fractions.  The lumpectomy cavity will be boosted 10 gray for a cumulative dose of 60.4 gray  -----------------------------------  Blair Promise, PhD, MD  This document serves as a record of services personally performed by Gery Pray, MD. It was created on his behalf by Maryla Morrow, a trained medical scribe. The  creation of this record is based on the scribe's personal observations and the provider's statements to them. This document has been checked and approved by the attending provider.

## 2016-06-30 DIAGNOSIS — C50312 Malignant neoplasm of lower-inner quadrant of left female breast: Secondary | ICD-10-CM | POA: Diagnosis not present

## 2016-06-30 DIAGNOSIS — Z510 Encounter for antineoplastic radiation therapy: Secondary | ICD-10-CM | POA: Diagnosis present

## 2016-06-30 DIAGNOSIS — Z170 Estrogen receptor positive status [ER+]: Secondary | ICD-10-CM | POA: Diagnosis not present

## 2016-07-05 ENCOUNTER — Ambulatory Visit
Admission: RE | Admit: 2016-07-05 | Discharge: 2016-07-05 | Disposition: A | Discharge status: Home / Self Care | Payer: Medicare Other | Source: Ambulatory Visit | Attending: Radiation Oncology | Admitting: Radiation Oncology

## 2016-07-05 DIAGNOSIS — Z510 Encounter for antineoplastic radiation therapy: Secondary | ICD-10-CM | POA: Diagnosis not present

## 2016-07-06 ENCOUNTER — Ambulatory Visit
Admission: RE | Admit: 2016-07-06 | Discharge: 2016-07-06 | Disposition: A | Discharge status: Home / Self Care | Payer: Medicare Other | Source: Ambulatory Visit | Attending: Radiation Oncology | Admitting: Radiation Oncology

## 2016-07-06 DIAGNOSIS — Z170 Estrogen receptor positive status [ER+]: Principal | ICD-10-CM

## 2016-07-06 DIAGNOSIS — C50312 Malignant neoplasm of lower-inner quadrant of left female breast: Secondary | ICD-10-CM

## 2016-07-06 DIAGNOSIS — Z510 Encounter for antineoplastic radiation therapy: Secondary | ICD-10-CM | POA: Diagnosis not present

## 2016-07-06 NOTE — Progress Notes (Signed)
Pt here for patient teaching.  Pt given Radiation and You booklet, Alra deodorant and Radiaplex gel.  Reviewed areas of pertinence such as hair loss, skin changes, breast tenderness and breast swelling . Pt able to give teach back of to pat skin and use unscented/gentle soap,apply Radiaplex bid, avoid applying anything to skin within 4 hours of treatment, avoid wearing an under wire bra and to use an electric razor if they must shave. Pt verbalizes understanding of information given and will contact nursing with any questions or concerns.

## 2016-07-07 ENCOUNTER — Ambulatory Visit
Admission: RE | Admit: 2016-07-07 | Discharge: 2016-07-07 | Disposition: A | Discharge status: Home / Self Care | Payer: Medicare Other | Source: Ambulatory Visit | Attending: Radiation Oncology | Admitting: Radiation Oncology

## 2016-07-07 DIAGNOSIS — Z510 Encounter for antineoplastic radiation therapy: Secondary | ICD-10-CM | POA: Diagnosis not present

## 2016-07-07 DIAGNOSIS — Z170 Estrogen receptor positive status [ER+]: Principal | ICD-10-CM

## 2016-07-07 DIAGNOSIS — C50312 Malignant neoplasm of lower-inner quadrant of left female breast: Secondary | ICD-10-CM

## 2016-07-07 MED ORDER — RADIAPLEXRX EX GEL
Freq: Once | CUTANEOUS | Status: AC
  Administered 2016-07-07: 12:00:00 via TOPICAL

## 2016-07-07 MED ORDER — ALRA NON-METALLIC DEODORANT (RAD-ONC)
1.0000 "application " | Freq: Once | TOPICAL | Status: AC
  Administered 2016-07-07: 1 via TOPICAL

## 2016-07-08 ENCOUNTER — Ambulatory Visit
Admission: RE | Admit: 2016-07-08 | Discharge: 2016-07-08 | Disposition: A | Discharge status: Home / Self Care | Payer: Medicare Other | Source: Ambulatory Visit | Attending: Radiation Oncology | Admitting: Radiation Oncology

## 2016-07-08 DIAGNOSIS — Z510 Encounter for antineoplastic radiation therapy: Secondary | ICD-10-CM | POA: Diagnosis not present

## 2016-07-09 ENCOUNTER — Ambulatory Visit
Admission: RE | Admit: 2016-07-09 | Discharge: 2016-07-09 | Disposition: A | Discharge status: Home / Self Care | Payer: Medicare Other | Source: Ambulatory Visit | Attending: Radiation Oncology | Admitting: Radiation Oncology

## 2016-07-09 DIAGNOSIS — Z510 Encounter for antineoplastic radiation therapy: Secondary | ICD-10-CM | POA: Diagnosis not present

## 2016-07-12 ENCOUNTER — Ambulatory Visit
Admission: RE | Admit: 2016-07-12 | Discharge: 2016-07-12 | Disposition: A | Discharge status: Home / Self Care | Payer: Medicare Other | Source: Ambulatory Visit | Attending: Radiation Oncology | Admitting: Radiation Oncology

## 2016-07-12 DIAGNOSIS — Z510 Encounter for antineoplastic radiation therapy: Secondary | ICD-10-CM | POA: Diagnosis not present

## 2016-07-13 ENCOUNTER — Ambulatory Visit
Admission: RE | Admit: 2016-07-13 | Discharge: 2016-07-13 | Disposition: A | Discharge status: Home / Self Care | Payer: Medicare Other | Source: Ambulatory Visit | Attending: Radiation Oncology | Admitting: Radiation Oncology

## 2016-07-13 DIAGNOSIS — Z510 Encounter for antineoplastic radiation therapy: Secondary | ICD-10-CM | POA: Diagnosis not present

## 2016-07-15 ENCOUNTER — Ambulatory Visit
Admission: RE | Admit: 2016-07-15 | Discharge: 2016-07-15 | Disposition: A | Discharge status: Home / Self Care | Payer: Medicare Other | Source: Ambulatory Visit | Attending: Radiation Oncology | Admitting: Radiation Oncology

## 2016-07-15 DIAGNOSIS — Z510 Encounter for antineoplastic radiation therapy: Secondary | ICD-10-CM | POA: Diagnosis not present

## 2016-07-16 ENCOUNTER — Ambulatory Visit
Admission: RE | Admit: 2016-07-16 | Discharge: 2016-07-16 | Disposition: A | Discharge status: Home / Self Care | Payer: Medicare Other | Source: Ambulatory Visit | Attending: Radiation Oncology | Admitting: Radiation Oncology

## 2016-07-16 DIAGNOSIS — Z510 Encounter for antineoplastic radiation therapy: Secondary | ICD-10-CM | POA: Diagnosis not present

## 2016-07-19 ENCOUNTER — Ambulatory Visit
Admission: RE | Admit: 2016-07-19 | Discharge: 2016-07-19 | Disposition: A | Discharge status: Home / Self Care | Payer: Medicare Other | Source: Ambulatory Visit | Attending: Radiation Oncology | Admitting: Radiation Oncology

## 2016-07-19 DIAGNOSIS — Z510 Encounter for antineoplastic radiation therapy: Secondary | ICD-10-CM | POA: Diagnosis not present

## 2016-07-20 ENCOUNTER — Ambulatory Visit
Admission: RE | Admit: 2016-07-20 | Discharge: 2016-07-20 | Disposition: A | Discharge status: Home / Self Care | Payer: Medicare Other | Source: Ambulatory Visit | Attending: Radiation Oncology | Admitting: Radiation Oncology

## 2016-07-20 DIAGNOSIS — Z510 Encounter for antineoplastic radiation therapy: Secondary | ICD-10-CM | POA: Diagnosis not present

## 2016-07-21 ENCOUNTER — Ambulatory Visit
Admission: RE | Admit: 2016-07-21 | Discharge: 2016-07-21 | Disposition: A | Discharge status: Home / Self Care | Payer: Medicare Other | Source: Ambulatory Visit | Attending: Radiation Oncology | Admitting: Radiation Oncology

## 2016-07-21 DIAGNOSIS — Z510 Encounter for antineoplastic radiation therapy: Secondary | ICD-10-CM | POA: Diagnosis not present

## 2016-07-22 ENCOUNTER — Ambulatory Visit
Admission: RE | Admit: 2016-07-22 | Discharge: 2016-07-22 | Disposition: A | Discharge status: Home / Self Care | Payer: Medicare Other | Source: Ambulatory Visit | Attending: Radiation Oncology | Admitting: Radiation Oncology

## 2016-07-22 DIAGNOSIS — Z510 Encounter for antineoplastic radiation therapy: Secondary | ICD-10-CM | POA: Diagnosis not present

## 2016-07-23 ENCOUNTER — Ambulatory Visit
Admission: RE | Admit: 2016-07-23 | Discharge: 2016-07-23 | Disposition: A | Discharge status: Home / Self Care | Payer: Medicare Other | Source: Ambulatory Visit | Attending: Radiation Oncology | Admitting: Radiation Oncology

## 2016-07-23 DIAGNOSIS — Z510 Encounter for antineoplastic radiation therapy: Secondary | ICD-10-CM | POA: Diagnosis not present

## 2016-07-26 ENCOUNTER — Ambulatory Visit
Admission: RE | Admit: 2016-07-26 | Discharge: 2016-07-26 | Disposition: A | Discharge status: Home / Self Care | Payer: Medicare Other | Source: Ambulatory Visit | Attending: Radiation Oncology | Admitting: Radiation Oncology

## 2016-07-26 DIAGNOSIS — Z510 Encounter for antineoplastic radiation therapy: Secondary | ICD-10-CM | POA: Diagnosis not present

## 2016-07-27 ENCOUNTER — Ambulatory Visit
Admission: RE | Admit: 2016-07-27 | Discharge: 2016-07-27 | Disposition: A | Discharge status: Home / Self Care | Payer: Medicare Other | Source: Ambulatory Visit | Attending: Radiation Oncology | Admitting: Radiation Oncology

## 2016-07-27 DIAGNOSIS — Z510 Encounter for antineoplastic radiation therapy: Secondary | ICD-10-CM | POA: Diagnosis not present

## 2016-07-28 ENCOUNTER — Ambulatory Visit
Admission: RE | Admit: 2016-07-28 | Discharge: 2016-07-28 | Disposition: A | Discharge status: Home / Self Care | Payer: Medicare Other | Source: Ambulatory Visit | Attending: Radiation Oncology | Admitting: Radiation Oncology

## 2016-07-28 DIAGNOSIS — Z510 Encounter for antineoplastic radiation therapy: Secondary | ICD-10-CM | POA: Diagnosis not present

## 2016-07-29 ENCOUNTER — Ambulatory Visit
Admission: RE | Admit: 2016-07-29 | Discharge: 2016-07-29 | Disposition: A | Discharge status: Home / Self Care | Payer: Medicare Other | Source: Ambulatory Visit | Attending: Radiation Oncology | Admitting: Radiation Oncology

## 2016-07-29 DIAGNOSIS — Z510 Encounter for antineoplastic radiation therapy: Secondary | ICD-10-CM | POA: Diagnosis not present

## 2016-07-30 ENCOUNTER — Ambulatory Visit
Admission: RE | Admit: 2016-07-30 | Discharge: 2016-07-30 | Disposition: A | Discharge status: Home / Self Care | Payer: Medicare Other | Source: Ambulatory Visit | Attending: Radiation Oncology | Admitting: Radiation Oncology

## 2016-07-30 DIAGNOSIS — Z510 Encounter for antineoplastic radiation therapy: Secondary | ICD-10-CM | POA: Diagnosis not present

## 2016-08-02 ENCOUNTER — Ambulatory Visit
Admission: RE | Admit: 2016-08-02 | Discharge: 2016-08-02 | Disposition: A | Discharge status: Home / Self Care | Payer: Medicare Other | Source: Ambulatory Visit | Attending: Radiation Oncology | Admitting: Radiation Oncology

## 2016-08-02 ENCOUNTER — Ambulatory Visit: Payer: Medicare Other | Admitting: Radiation Oncology

## 2016-08-02 DIAGNOSIS — Z510 Encounter for antineoplastic radiation therapy: Secondary | ICD-10-CM | POA: Diagnosis not present

## 2016-08-03 ENCOUNTER — Ambulatory Visit
Admission: RE | Admit: 2016-08-03 | Discharge: 2016-08-03 | Disposition: A | Discharge status: Home / Self Care | Payer: Medicare Other | Source: Ambulatory Visit | Attending: Radiation Oncology | Admitting: Radiation Oncology

## 2016-08-03 DIAGNOSIS — Z510 Encounter for antineoplastic radiation therapy: Secondary | ICD-10-CM | POA: Diagnosis not present

## 2016-08-03 DIAGNOSIS — Z170 Estrogen receptor positive status [ER+]: Principal | ICD-10-CM

## 2016-08-03 DIAGNOSIS — C50312 Malignant neoplasm of lower-inner quadrant of left female breast: Secondary | ICD-10-CM

## 2016-08-03 MED ORDER — SONAFINE EX EMUL
1.0000 "application " | Freq: Once | CUTANEOUS | Status: AC
  Administered 2016-08-03: 1 via TOPICAL

## 2016-08-04 ENCOUNTER — Ambulatory Visit
Admission: RE | Admit: 2016-08-04 | Discharge: 2016-08-04 | Disposition: A | Discharge status: Home / Self Care | Payer: Medicare Other | Source: Ambulatory Visit | Attending: Radiation Oncology | Admitting: Radiation Oncology

## 2016-08-04 DIAGNOSIS — Z510 Encounter for antineoplastic radiation therapy: Secondary | ICD-10-CM | POA: Diagnosis not present

## 2016-08-05 ENCOUNTER — Ambulatory Visit
Admission: RE | Admit: 2016-08-05 | Discharge: 2016-08-05 | Disposition: A | Discharge status: Home / Self Care | Payer: Medicare Other | Source: Ambulatory Visit | Attending: Radiation Oncology | Admitting: Radiation Oncology

## 2016-08-05 DIAGNOSIS — Z510 Encounter for antineoplastic radiation therapy: Secondary | ICD-10-CM | POA: Diagnosis not present

## 2016-08-06 ENCOUNTER — Ambulatory Visit
Admission: RE | Admit: 2016-08-06 | Discharge: 2016-08-06 | Disposition: A | Discharge status: Home / Self Care | Payer: Medicare Other | Source: Ambulatory Visit | Attending: Radiation Oncology | Admitting: Radiation Oncology

## 2016-08-06 DIAGNOSIS — Z510 Encounter for antineoplastic radiation therapy: Secondary | ICD-10-CM | POA: Diagnosis not present

## 2016-08-09 ENCOUNTER — Ambulatory Visit
Admission: RE | Admit: 2016-08-09 | Discharge: 2016-08-09 | Disposition: A | Discharge status: Home / Self Care | Payer: Medicare Other | Source: Ambulatory Visit | Attending: Radiation Oncology | Admitting: Radiation Oncology

## 2016-08-09 DIAGNOSIS — Z510 Encounter for antineoplastic radiation therapy: Secondary | ICD-10-CM | POA: Diagnosis not present

## 2016-08-10 ENCOUNTER — Ambulatory Visit
Admission: RE | Admit: 2016-08-10 | Discharge: 2016-08-10 | Disposition: A | Discharge status: Home / Self Care | Payer: Medicare Other | Source: Ambulatory Visit | Attending: Radiation Oncology | Admitting: Radiation Oncology

## 2016-08-10 DIAGNOSIS — Z510 Encounter for antineoplastic radiation therapy: Secondary | ICD-10-CM | POA: Diagnosis not present

## 2016-08-10 DIAGNOSIS — C50312 Malignant neoplasm of lower-inner quadrant of left female breast: Secondary | ICD-10-CM

## 2016-08-10 DIAGNOSIS — Z170 Estrogen receptor positive status [ER+]: Principal | ICD-10-CM

## 2016-08-10 MED ORDER — RADIAPLEXRX EX GEL
Freq: Once | CUTANEOUS | Status: AC
  Administered 2016-08-10: 12:00:00 via TOPICAL

## 2016-08-10 NOTE — Progress Notes (Signed)
  Radiation Oncology         [phone] ________________________________  Name: Jeanne Thompson MRN: [id number]  Date: 08/10/2016  DOB: 07-Dec-1940  Electron Simulation Note    ICD-10-CM   1. Malignant neoplasm of lower-inner quadrant of left breast in female, estrogen receptor positive ([place]) C50.312    Z17.0     Status: outpatient  NARRATIVE: The patient was brought to the treatment unit and placed in the planned treatment position. The clinical setup was verified. The patient then had set up of her custom electron cutout field encompassing the target area. The patient will be treated with  electrons.. A special port plan is requested for treatment.  PLAN: The patient will receive 5 treatments for a boost dose of 10 Gy and cumulative dose of 2 Gy. -----------------------------------  Blair Promise, PhD, MD

## 2016-08-11 ENCOUNTER — Ambulatory Visit
Admission: RE | Admit: 2016-08-11 | Discharge: 2016-08-11 | Disposition: A | Discharge status: Home / Self Care | Payer: Medicare Other | Source: Ambulatory Visit | Attending: Radiation Oncology | Admitting: Radiation Oncology

## 2016-08-11 DIAGNOSIS — Z510 Encounter for antineoplastic radiation therapy: Secondary | ICD-10-CM | POA: Diagnosis not present

## 2016-08-12 ENCOUNTER — Ambulatory Visit
Admission: RE | Admit: 2016-08-12 | Discharge: 2016-08-12 | Disposition: A | Discharge status: Home / Self Care | Payer: Medicare Other | Source: Ambulatory Visit | Attending: Radiation Oncology | Admitting: Radiation Oncology

## 2016-08-12 DIAGNOSIS — Z510 Encounter for antineoplastic radiation therapy: Secondary | ICD-10-CM | POA: Diagnosis not present

## 2016-08-13 ENCOUNTER — Ambulatory Visit
Admission: RE | Admit: 2016-08-13 | Discharge: 2016-08-13 | Disposition: A | Discharge status: Home / Self Care | Payer: Medicare Other | Source: Ambulatory Visit | Attending: Radiation Oncology | Admitting: Radiation Oncology

## 2016-08-13 DIAGNOSIS — Z510 Encounter for antineoplastic radiation therapy: Secondary | ICD-10-CM | POA: Diagnosis not present

## 2016-08-16 ENCOUNTER — Ambulatory Visit
Admission: RE | Admit: 2016-08-16 | Discharge: 2016-08-16 | Disposition: A | Discharge status: Home / Self Care | Payer: Medicare Other | Source: Ambulatory Visit | Attending: Radiation Oncology | Admitting: Radiation Oncology

## 2016-08-16 DIAGNOSIS — Z510 Encounter for antineoplastic radiation therapy: Secondary | ICD-10-CM | POA: Diagnosis not present

## 2016-08-17 ENCOUNTER — Ambulatory Visit
Admission: RE | Admit: 2016-08-17 | Discharge: 2016-08-17 | Disposition: A | Discharge status: Home / Self Care | Payer: Medicare Other | Source: Ambulatory Visit | Attending: Radiation Oncology | Admitting: Radiation Oncology

## 2016-08-17 DIAGNOSIS — Z510 Encounter for antineoplastic radiation therapy: Secondary | ICD-10-CM | POA: Diagnosis not present

## 2016-08-18 ENCOUNTER — Ambulatory Visit
Admission: RE | Admit: 2016-08-18 | Discharge: 2016-08-18 | Disposition: A | Discharge status: Home / Self Care | Payer: Medicare Other | Source: Ambulatory Visit | Attending: Radiation Oncology | Admitting: Radiation Oncology

## 2016-08-18 DIAGNOSIS — Z510 Encounter for antineoplastic radiation therapy: Secondary | ICD-10-CM | POA: Diagnosis not present

## 2016-08-19 ENCOUNTER — Ambulatory Visit
Admission: RE | Admit: 2016-08-19 | Discharge: 2016-08-19 | Disposition: A | Discharge status: Home / Self Care | Payer: Medicare Other | Source: Ambulatory Visit | Attending: Radiation Oncology | Admitting: Radiation Oncology

## 2016-08-19 DIAGNOSIS — Z510 Encounter for antineoplastic radiation therapy: Secondary | ICD-10-CM | POA: Diagnosis not present

## 2016-08-19 DIAGNOSIS — C50312 Malignant neoplasm of lower-inner quadrant of left female breast: Secondary | ICD-10-CM

## 2016-08-19 DIAGNOSIS — Z170 Estrogen receptor positive status [ER+]: Principal | ICD-10-CM

## 2016-08-19 MED ORDER — RADIAPLEXRX EX GEL
Freq: Once | CUTANEOUS | Status: AC
  Administered 2016-08-19: 11:00:00 via TOPICAL

## 2016-08-20 ENCOUNTER — Ambulatory Visit
Admission: RE | Admit: 2016-08-20 | Discharge: 2016-08-20 | Disposition: A | Discharge status: Home / Self Care | Payer: Medicare Other | Source: Ambulatory Visit | Attending: Radiation Oncology | Admitting: Radiation Oncology

## 2016-08-20 ENCOUNTER — Encounter: Payer: Self-pay | Admitting: Radiation Oncology

## 2016-08-20 DIAGNOSIS — Z510 Encounter for antineoplastic radiation therapy: Secondary | ICD-10-CM | POA: Diagnosis not present

## 2016-08-23 NOTE — Progress Notes (Signed)
  Radiation Oncology         [phone]) [phone] ________________________________  Name: Jeanne Thompson MRN: [id number]  Date: 08/20/2016  DOB: 1940/03/25  End of Treatment Note  Diagnosis:   76 y.o. female with Stage IIB (pT2, pN1a, cM0) Left Breast LIQ Invasive Ductal Carcinoma, ER/PR Positive, Her2 Negative, Grade 1     Indication for treatment:  Curative       Radiation treatment dates:   07/06/2016 - 08/20/2016  Site/dose:    1) The left breast was treated to 50.4 Gy in 28 fractions of 1.8 Gy. 2) The left axilla was treated to 45 Gy in 25 fractions of 1.8 Gy. 3) The left breast was boosted an additional 10 Gy in 5 fractions of 2 Gy, for a total dose of 60.4 Gy to the left breast.  Beams/energy:    1) 3D // 6X Photon 2) 3D // Linna Caprice, 6X Photon 3) 3D // 15x, 12x Photon  Narrative: The patient tolerated radiation treatment relatively well. She reported some fatigue but denied having pain. She said that she did have some itching in the treatment area. On physical exam, the treatment site was red with dermatitis and showed dry desquamation, for which she was given radiaplex and sonafine.   Plan: The patient has completed radiation treatment. The patient will return to radiation oncology clinic for routine followup in one month. I advised them to call or return sooner if they have any questions or concerns related to their recovery or treatment.  -----------------------------------  Blair Promise, PhD, MD  This document serves as a record of services personally performed by Gery Pray, MD. It was created on his behalf by Rae Lips, a trained medical scribe. The creation of this record is based on the scribe's personal observations and the provider's statements to them. This document has been checked and approved by the attending provider.

## 2016-09-06 ENCOUNTER — Other Ambulatory Visit: Payer: Self-pay | Admitting: *Deleted

## 2016-09-06 DIAGNOSIS — Z170 Estrogen receptor positive status [ER+]: Principal | ICD-10-CM

## 2016-09-06 DIAGNOSIS — C50312 Malignant neoplasm of lower-inner quadrant of left female breast: Secondary | ICD-10-CM

## 2016-09-07 ENCOUNTER — Ambulatory Visit (HOSPITAL_BASED_OUTPATIENT_CLINIC_OR_DEPARTMENT_OTHER): Payer: Medicare Other | Admitting: Oncology

## 2016-09-07 ENCOUNTER — Other Ambulatory Visit (HOSPITAL_BASED_OUTPATIENT_CLINIC_OR_DEPARTMENT_OTHER): Payer: Medicare Other

## 2016-09-07 VITALS — BP 122/63 | HR 83 | Temp 98.0°F | Resp 20 | Ht 64.0 in | Wt 159.9 lb

## 2016-09-07 DIAGNOSIS — C50312 Malignant neoplasm of lower-inner quadrant of left female breast: Secondary | ICD-10-CM

## 2016-09-07 DIAGNOSIS — Z170 Estrogen receptor positive status [ER+]: Principal | ICD-10-CM

## 2016-09-07 DIAGNOSIS — Z79811 Long term (current) use of aromatase inhibitors: Secondary | ICD-10-CM | POA: Diagnosis not present

## 2016-09-07 LAB — CBC WITH DIFFERENTIAL/PLATELET
BASO%: 0.3 % (ref 0.0–2.0)
Basophils Absolute: 0 10*3/uL (ref 0.0–0.1)
EOS ABS: 0.3 10*3/uL (ref 0.0–0.5)
EOS%: 4.9 % (ref 0.0–7.0)
HEMATOCRIT: 32.2 % — AB (ref 34.8–46.6)
HGB: 10.8 g/dL — ABNORMAL LOW (ref 11.6–15.9)
LYMPH#: 1 10*3/uL (ref 0.9–3.3)
LYMPH%: 19.2 % (ref 14.0–49.7)
MCH: 31.8 pg (ref 25.1–34.0)
MCHC: 33.4 g/dL (ref 31.5–36.0)
MCV: 95.1 fL (ref 79.5–101.0)
MONO#: 0.7 10*3/uL (ref 0.1–0.9)
MONO%: 13.8 % (ref 0.0–14.0)
NEUT#: 3.3 10*3/uL (ref 1.5–6.5)
NEUT%: 61.8 % (ref 38.4–76.8)
PLATELETS: 148 10*3/uL (ref 145–400)
RBC: 3.39 10*6/uL — ABNORMAL LOW (ref 3.70–5.45)
RDW: 13.5 % (ref 11.2–14.5)
WBC: 5.4 10*3/uL (ref 3.9–10.3)

## 2016-09-07 LAB — COMPREHENSIVE METABOLIC PANEL
ALT: 17 U/L (ref 0–55)
ANION GAP: 5 meq/L (ref 3–11)
AST: 13 U/L (ref 5–34)
Albumin: 3.3 g/dL — ABNORMAL LOW (ref 3.5–5.0)
Alkaline Phosphatase: 60 U/L (ref 40–150)
BILIRUBIN TOTAL: 0.24 mg/dL (ref 0.20–1.20)
BUN: 16 mg/dL (ref 7.0–26.0)
CALCIUM: 9.2 mg/dL (ref 8.4–10.4)
CHLORIDE: 110 meq/L — AB (ref 98–109)
CO2: 27 mEq/L (ref 22–29)
CREATININE: 0.9 mg/dL (ref 0.6–1.1)
EGFR: 67 mL/min/{1.73_m2} — AB (ref >90)
Glucose: 95 mg/dl (ref 70–140)
Potassium: 4.1 mEq/L (ref 3.5–5.1)
Sodium: 142 mEq/L (ref 136–145)
Total Protein: 6.4 g/dL (ref 6.4–8.3)

## 2016-09-07 NOTE — Progress Notes (Signed)
[place]  [phone] [phone]     ID: [name] DOB: 06-Oct-1940  MR#: [id number]  GMW#:[id number]  Patient Care Team: Gennette Pac, NP as PCP - General (Internal Medicine) Sulema Braid, Virgie Dad, MD as Consulting Physician (Oncology) Jovita Kussmaul, MD as Consulting Physician (General Surgery) Gery Pray, MD as Consulting Physician (Radiation Oncology) Abbe Amsterdam, MD as Referring Physician (Surgery) Dimas Alexandria, MD as Referring Physician (Cardiology) Chauncey Cruel, MD OTHER MD:  CHIEF COMPLAINT: Estrogen receptor positive breast cancer  CURRENT TREATMENT: Tamoxifen   BREAST CANCER HISTORY: From the original intake note:   Jeanne Thompson had routine screening mammography 04/05/2016 showing a possible mass in the left breast. She was brought back for left diagnostic mammography and left breast ultrasonography 04/20/2016. The breast density was category C. There was a spiculated mass in the lower inner portion of the left breast. On physical exam this was not palpable. Targeted ultrasound did confirm an irregular mass at the 6:15 o'clock location of the left breast 3 cm from the nipple, measuring 1.7 cm maximally.  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  She continued on tamoxifen right through all her treatments. She does have brief hot flashes. She has minimal vaginal wetness. She obtains a drug at a good price.  INTERVAL HISTORY: Anyia returns today for follow-up and treatment of her estrogen receptor positive breast cancer. Since her last visit here she underwent left lumpectomy and sentinel lymph node biopsy, on 05/27/2016. This showed ([id number] [phone]) invasive ductal  carcinoma, grade 1, measuring 2.4 cm, involving one of 2 sentinel lymph nodes. Origins were clear. Since she met Z-11 criteria completion axillary dissection was not performed but she proceeded to radiation, which she completed a 10/30/2016.  She is here today to discuss systemic therapy  REVIEW OF SYSTEMS: She had some blistering from the radiation as well as significant fatigue, but she is feeling a little bit better. She is walking up to 30 minutes at a time. She feels a little fuzzy headed at times. Aside from these issues a detailed review of systems today was stable PAST MEDICAL HISTORY: Past Medical History:  Diagnosis Date  . Anxiety   . Arthritis    low back  . Asthma   . Complication of anesthesia   . Depression   . Enlarged heart   . GERD (gastroesophageal reflux disease)   . High cholesterol   . Hypertension   . PONV (postoperative nausea and vomiting)     PAST SURGICAL HISTORY: Past Surgical History:  Procedure Laterality Date  . ABDOMINAL HYSTERECTOMY     left 1 ovary  . APPENDECTOMY    . BREAST LUMPECTOMY WITH RADIOACTIVE SEED AND SENTINEL LYMPH NODE BIOPSY Left 05/27/2016   Procedure: BREAST LUMPECTOMY WITH RADIOACTIVE SEED AND SENTINEL LYMPH NODE BIOPSY;  Surgeon: Jovita Kussmaul, MD;  Location: [facility];  Service: General;  Laterality: Left;  . BREAST SURGERY Left    benign  . CHOLECYSTECTOMY    . EYE SURGERY Left    cataract  . PARTIAL HYSTERECTOMY    . THYROIDECTOMY, PARTIAL    . TONSILECTOMY/ADENOIDECTOMY WITH MYRINGOTOMY    . TONSILLECTOMY      FAMILY HISTORY Family History  Problem Relation Age of  Onset  . Breast cancer Mother   . Colon cancer Brother   . Breast cancer Maternal Grandfather   . Breast cancer Paternal Grandmother   The patient's father died from myocardial infarction at age 70 in the setting of tobacco abuse. The patient's mother died at age 65. She had been diagnosed with breast cancer at age 74 and again at age 64.  The patient had one brother, no sisters. The brother had colon cancer diagnosed at age 12. A maternal grandmother was diagnosed with breast cancer in her 30s. A paternal grandmother was diagnosed with breast cancer as well as well as 2 paternal aunts and in addition one paternal cousin was diagnosed with ovarian cancer. The patient herself has been tested for the BRCA mutation in was negative  GYNECOLOGIC HISTORY:  No LMP recorded. Patient has had a hysterectomy. Menarche age 36, first live birth age 69, she is [place] P2. She underwent hysterectomy with unilateral salpingo-oophorectomy age 18. She did not use hormone replacement. She did use oral contraceptives remotely for about 4 years.  SOCIAL HISTORY:  She worked as a Pharmacist, hospital, chiefly in reading, and still works part-time. She is divorced, lives alone with her dog appendectomy. Son today what cans lives in [place] and works in Press photographer. Son Jeanne Thompson lives in [place] and is vice Radio producer and service. The patient has 4 grandchildren by a logically and 2 step grandchildren. She is a [place]    ADVANCED DIRECTIVES: She has the advanced directive papers but has not yet completed or notarize them, as of the 05/05/2016 visit   HEALTH MAINTENANCE: Social History  Substance Use Topics  . Smoking status: Never Smoker  . Smokeless tobacco: Never Used  . Alcohol use Yes     Comment: social     Colonoscopy:  PAP:  Bone density:   No Known Allergies  Current Outpatient Prescriptions  Medication Sig Dispense Refill  . albuterol (PROVENTIL HFA;VENTOLIN HFA) 108 (90 Base) MCG/ACT inhaler Inhale 2 puffs into the lungs every 4 (four) hours as needed for wheezing or shortness of breath.    Marland Kitchen atorvastatin (LIPITOR) 10 MG tablet Take 10 mg by mouth daily.    . budesonide (PULMICORT) 180 MCG/ACT inhaler Inhale into the lungs 2 (two) times daily.    . clobetasol ointment (TEMOVATE) 4.58 % Apply 1 application topically 2 (two) times  daily.    . enalapril (VASOTEC) 2.5 MG tablet Take 5 mg by mouth daily.     Marland Kitchen esomeprazole (NEXIUM) 40 MG capsule Take 40 mg by mouth daily at 12 noon.    Marland Kitchen estradiol (ESTRACE) 0.1 MG/GM vaginal cream Place 1 Applicatorful vaginally at bedtime.    . sertraline (ZOLOFT) 100 MG tablet Take 100 mg by mouth daily.    . tamoxifen (NOLVADEX) 10 MG tablet Take 10 mg by mouth daily.     No current facility-administered medications for this visit.     OBJECTIVE: Older white woman In no acute distress  Vitals:   09/07/16 1308  BP: 122/63  Pulse: 83  Resp: 20  Temp: 98 F (36.7 C)  SpO2: 95%     Body mass index is 27.45 kg/m.    ECOG FS:1 - Symptomatic but completely ambulatory  Sclerae unicteric, pupils round and equal Oropharynx clear and moist No cervical or supraclavicular adenopathy Lungs no rales or rhonchi Heart regular rate and rhythm Abd soft, nontender, positive bowel sounds MSK no focal spinal tenderness, no upper extremity lymphedema Neuro: nonfocal, well  oriented, appropriate affect Breasts: The right breast is benign. The left breast is status post lumpectomy and radiation. The splenic result is good. There is still erythema from the recent treatments. There is no evidence of local recurrence. Both axillae are benign.  LAB RESULTS:  CMP     Component Value Date/Time   NA 142 05/05/2016 1208   K 4.0 05/05/2016 1208   CO2 27 05/05/2016 1208   GLUCOSE 123 05/05/2016 1208   BUN 16.4 05/05/2016 1208   CREATININE 1.0 05/05/2016 1208   CALCIUM 9.5 05/05/2016 1208   PROT 7.1 05/05/2016 1208   ALBUMIN 4.0 05/05/2016 1208   AST 22 05/05/2016 1208   ALT 29 05/05/2016 1208   ALKPHOS 75 05/05/2016 1208   BILITOT 0.29 05/05/2016 1208    No results found for: TOTALPROTELP, ALBUMINELP, A1GS, A2GS, BETS, BETA2SER, GAMS, MSPIKE, SPEI  No results found for: Nils Pyle, [facility]  Lab Results  Component Value Date   WBC 5.4 09/07/2016   NEUTROABS 3.3  09/07/2016   HGB 10.8 (L) 09/07/2016   HCT 32.2 (L) 09/07/2016   MCV 95.1 09/07/2016   PLT 148 09/07/2016      Chemistry      Component Value Date/Time   NA 142 05/05/2016 1208   K 4.0 05/05/2016 1208   CO2 27 05/05/2016 1208   BUN 16.4 05/05/2016 1208   CREATININE 1.0 05/05/2016 1208      Component Value Date/Time   CALCIUM 9.5 05/05/2016 1208   ALKPHOS 75 05/05/2016 1208   AST 22 05/05/2016 1208   ALT 29 05/05/2016 1208   BILITOT 0.29 05/05/2016 1208       No results found for: LABCA2  No components found for: [id number]  No results for input(s): INR in the last 168 hours.  Urinalysis No results found for: COLORURINE, APPEARANCEUR, LABSPEC, PHURINE, GLUCOSEU, HGBUR, BILIRUBINUR, KETONESUR, PROTEINUR, UROBILINOGEN, NITRITE, LEUKOCYTESUR   STUDIES:  No results found.  ELIGIBLE FOR AVAILABLE RESEARCH PROTOCOL: no  ASSESSMENT: 76 y.o. BRCA negative [facility] woman status post left breast lower inner quadrant biopsy 04/29/2016 for a clinical pT1c pN0, tage IA s invasive ductal carcinoma,  grade 2, estrogen and progesterone receptor positive, HER-2 nonamplified, with an MIB-1 of 10%.   (1) tamoxifen started neoadjuvantly 05/05/2016  (2) additional genetics testing pending  (3) Status post left lumpectomy and sentinel lymph node sampling 05/27/2016 for a pT2 pN1, stage IIA invasive ductal carcinoma, grade 1, with negative margins.  (4) Mammaprint read as low risk, indicating no significant chemotherapy benefit.  (5) adjuvant radiation 07/06/2016 - 08/20/2016 Site/dose:    1) The left breast was treated to 50.4 Gy in 28 fractions of 1.8 Gy. 2) The left axilla was treated to 45 Gy in 25 fractions of 1.8 Gy. 3) The left breast was boosted an additional 10 Gy in 5 fractions of 2 Gy, for a total dose of 60.4 Gy to the left breast.  (6) continuing on tamoxifen to complete 5 years   PLAN: Ethlyn has completed her local therapy and is recovering well from the  radiation. She understands she may not regain her full energy for another 2-3 months. Her skin should heal completely but she may have some skin thickening and some increased firmness and shrinkage of the breast over time.   She understands that some breast discomfort including sensitivity, soreness, and some shooting pains, are common and benign if they were to occur.  She is tolerating tamoxifen well. The plan is to continue that for  total of 5 years.  She will see me again in May. We will consider going to yearly follow-up at that point considering that she already sees multiple other physicians on a regular basis  She knows to call for any problems that may develop before that visit.Marland Kitchen  Chauncey Cruel, MD   09/07/2016 1:30 PM Medical Oncology and Hematology [facility] [address] [place], [place] [address] Tel. [phone]    Fax. [phone]

## 2016-09-20 ENCOUNTER — Ambulatory Visit: Payer: Medicare Other | Admitting: Radiation Oncology

## 2016-09-27 ENCOUNTER — Ambulatory Visit
Admission: RE | Admit: 2016-09-27 | Discharge: 2016-09-27 | Disposition: A | Discharge status: Home / Self Care | Payer: Medicare Other | Source: Ambulatory Visit | Attending: Radiation Oncology | Admitting: Radiation Oncology

## 2016-09-27 ENCOUNTER — Encounter: Payer: Self-pay | Admitting: Oncology

## 2016-09-27 DIAGNOSIS — Z170 Estrogen receptor positive status [ER+]: Secondary | ICD-10-CM | POA: Insufficient documentation

## 2016-09-27 DIAGNOSIS — Z79899 Other long term (current) drug therapy: Secondary | ICD-10-CM | POA: Insufficient documentation

## 2016-09-27 DIAGNOSIS — C50312 Malignant neoplasm of lower-inner quadrant of left female breast: Secondary | ICD-10-CM | POA: Insufficient documentation

## 2016-09-27 DIAGNOSIS — N644 Mastodynia: Secondary | ICD-10-CM | POA: Insufficient documentation

## 2016-09-27 DIAGNOSIS — R5383 Other fatigue: Secondary | ICD-10-CM | POA: Insufficient documentation

## 2016-10-04 ENCOUNTER — Ambulatory Visit
Admission: RE | Admit: 2016-10-04 | Discharge: 2016-10-04 | Disposition: A | Discharge status: Home / Self Care | Payer: Medicare Other | Source: Ambulatory Visit | Attending: Radiation Oncology | Admitting: Radiation Oncology

## 2016-10-04 ENCOUNTER — Encounter: Payer: Self-pay | Admitting: Radiation Oncology

## 2016-10-04 DIAGNOSIS — C50312 Malignant neoplasm of lower-inner quadrant of left female breast: Secondary | ICD-10-CM

## 2016-10-04 DIAGNOSIS — Z79899 Other long term (current) drug therapy: Secondary | ICD-10-CM | POA: Diagnosis not present

## 2016-10-04 DIAGNOSIS — Z170 Estrogen receptor positive status [ER+]: Secondary | ICD-10-CM | POA: Diagnosis not present

## 2016-10-04 DIAGNOSIS — R5383 Other fatigue: Secondary | ICD-10-CM | POA: Diagnosis not present

## 2016-10-04 DIAGNOSIS — N644 Mastodynia: Secondary | ICD-10-CM | POA: Diagnosis not present

## 2016-10-04 NOTE — Progress Notes (Signed)
Jeanne Thompson is here for follow up after treatment to her left breast.  She reports having occasional sharp pains in her left breast.  She also reports having fatigue.  She is taking tamoxifen.  She is wondering who will be ordering her next mammogram.  The skin on her left breast has hyperpigmentation.  BP 111/65 (BP Location: Right Arm, Patient Position: Sitting)   Pulse 84   Temp 98.2 F (36.8 C) (Oral)   Ht 5\' 4"  (1.626 m)   Wt 159 lb 12.8 oz (72.5 kg)   SpO2 97%   BMI 27.43 kg/m    Wt Readings from Last 3 Encounters:  10/04/16 159 lb 12.8 oz (72.5 kg)  09/07/16 159 lb 14.4 oz (72.5 kg)  06/14/16 156 lb 6.4 oz (70.9 kg)

## 2016-10-04 NOTE — Progress Notes (Signed)
Radiation Oncology         [phone] ________________________________  Name: Jeanne Thompson MRN: [id number]  Date: 10/04/2016  DOB: September 30, 1940  Follow-Up Visit Note  CC: Gennette Pac, NP  Jovita Kussmaul, MD    ICD-10-CM   1. Malignant neoplasm of lower-inner quadrant of left breast in female, estrogen receptor positive ([place]) C50.312    Z17.0     Diagnosis:   Stage IIB (pT2, pN1a, cM0) left breast LIQ Invasive ductal carcinoma, ER/PR positive, HER 2 negative, grade 1  Interval Since Last Radiation:  1 months with treatment dates of 07/06/2016-08/20/2016  Narrative:  The patient returns today for routine follow-up.  She notes that she is doing well overall. She is on prescription tamoxifen and is tolerating this well. Pt also voices concern for when her next mammogram will be scheduled.    On review of systems, she reports intermittent, sharp shooting pains to left breast and fatigue. She reports radiation related skin changes to her left breast.                            ALLERGIES:  has No Known Allergies.  Meds: Current Outpatient Prescriptions  Medication Sig Dispense Refill  . albuterol (PROVENTIL HFA;VENTOLIN HFA) 108 (90 Base) MCG/ACT inhaler Inhale 2 puffs into the lungs every 4 (four) hours as needed for wheezing or shortness of breath.    Marland Kitchen atorvastatin (LIPITOR) 10 MG tablet Take 10 mg by mouth daily.    . budesonide (PULMICORT) 180 MCG/ACT inhaler Inhale into the lungs 2 (two) times daily.    . clobetasol ointment (TEMOVATE) 9.62 % Apply 1 application topically 2 (two) times daily.    . enalapril (VASOTEC) 2.5 MG tablet Take 5 mg by mouth daily.     Marland Kitchen esomeprazole (NEXIUM) 40 MG capsule Take 40 mg by mouth daily at 12 noon.    Marland Kitchen estradiol (ESTRACE) 0.1 MG/GM vaginal cream Place 1 Applicatorful vaginally at bedtime.    . sertraline (ZOLOFT) 100 MG tablet Take 100 mg by mouth daily.    . tamoxifen (NOLVADEX) 10 MG tablet Take 10 mg by mouth daily.     No current  facility-administered medications for this encounter.     Physical Findings: The patient is in no acute distress. Patient is alert and oriented.  height is 5\' 4"  (1.626 m) and weight is 159 lb 12.8 oz (72.5 kg). Her oral temperature is 98.2 F (36.8 C). Her blood pressure is 111/65 and her pulse is 84. Her oxygen saturation is 97%. .  No significant changes. Lungs are clear to auscultation bilaterally. Heart has regular rate and rhythm. No palpable cervical, supraclavicular, or axillary adenopathy. Abdomen soft, non-tender, normal bowel sounds. Right breast with no palpable mass or nipple discharge. Question cyst in the upper border of areola. Left breast with mild edema in the breast from treatment. Mild hyperpigmentation changes. No dominant mass appreciated in the chest, nipple discharge or bleeding.    Lab Findings: Lab Results  Component Value Date   WBC 5.4 09/07/2016   HGB 10.8 (L) 09/07/2016   HCT 32.2 (L) 09/07/2016   MCV 95.1 09/07/2016   PLT 148 09/07/2016    Radiographic Findings: No results found.  Impression:  The patient is recovering from the effects of radiation. No evidence of recurrence on clinical exam today.   Plan:  Return for routine follow up in 6 months.   ____________________________________ -----------------------------------  Nelda Severe.  Sondra Come, PhD, MD  This document serves as a record of services personally performed by Gery Pray, MD. It was created on her behalf by Steva Colder, a trained medical scribe. The creation of this record is based on the scribe's personal observations and the provider's statements to them. This document has been checked and approved by the attending provider.

## 2016-10-12 ENCOUNTER — Telehealth: Payer: Self-pay | Admitting: Oncology

## 2016-10-12 NOTE — Telephone Encounter (Signed)
Left a message for [facility] requesting patients mammogram report from February 2018.

## 2016-11-30 ENCOUNTER — Encounter: Payer: Medicare Other | Admitting: Adult Health

## 2017-03-31 ENCOUNTER — Other Ambulatory Visit: Payer: Self-pay | Admitting: *Deleted

## 2017-03-31 ENCOUNTER — Encounter: Payer: Self-pay | Admitting: Radiation Oncology

## 2017-03-31 ENCOUNTER — Other Ambulatory Visit: Payer: Self-pay

## 2017-03-31 ENCOUNTER — Ambulatory Visit
Admission: RE | Admit: 2017-03-31 | Discharge: 2017-03-31 | Disposition: A | Discharge status: Home / Self Care | Payer: Medicare Other | Source: Ambulatory Visit | Attending: Radiation Oncology | Admitting: Radiation Oncology

## 2017-03-31 ENCOUNTER — Telehealth: Payer: Self-pay | Admitting: Oncology

## 2017-03-31 VITALS — BP 113/67 | HR 78 | Temp 98.2°F | Resp 18 | Wt 153.4 lb

## 2017-03-31 DIAGNOSIS — Z170 Estrogen receptor positive status [ER+]: Secondary | ICD-10-CM | POA: Diagnosis present

## 2017-03-31 DIAGNOSIS — Z79899 Other long term (current) drug therapy: Secondary | ICD-10-CM | POA: Diagnosis not present

## 2017-03-31 DIAGNOSIS — C50312 Malignant neoplasm of lower-inner quadrant of left female breast: Secondary | ICD-10-CM | POA: Diagnosis not present

## 2017-03-31 DIAGNOSIS — L818 Other specified disorders of pigmentation: Secondary | ICD-10-CM | POA: Insufficient documentation

## 2017-03-31 NOTE — Telephone Encounter (Signed)
Left a message for Dr. Virgie Dad nurse regarding ordering a mammagram at [facility] per Dr. Sondra Come.

## 2017-03-31 NOTE — Progress Notes (Signed)
  Radiation Oncology         [phone] ________________________________  Name: Jeanne Thompson MRN: [id number]  Date: 03/31/2017  DOB: February 07, 1940  Follow-Up Visit Note  CC: Gennette Pac, NP  Jovita Kussmaul, MD    ICD-10-CM   1. Malignant neoplasm of lower-inner quadrant of left breast in female, estrogen receptor positive ([place]) C50.312    Z17.0     Diagnosis:   Stage IIB (pT2, pN1a, cM0) left breast LIQ Invasive ductal carcinoma, ER/PR positive, HER 2 negative, grade 1    Interval Since Last Radiation:  7 months, treatment dates of 07/06/2016-08/20/2016    Narrative:  The patient returns today for routine follow-up.  States that she has dull pains in her breast sometime. Denies any fatigue. Denies any issues with her skin. States that she uses radaiplex on her skin sometime. Patient has an appointment with Dr.Magninat on 06/07/2017.  She denies any nipple discharge or bleeding. sHe denies any swelling in her left arm or hand.                                ALLERGIES:  has No Known Allergies.  Meds: Current Outpatient Medications  Medication Sig Dispense Refill  . albuterol (PROVENTIL HFA;VENTOLIN HFA) 108 (90 Base) MCG/ACT inhaler Inhale 2 puffs into the lungs every 4 (four) hours as needed for wheezing or shortness of breath.    Marland Kitchen atorvastatin (LIPITOR) 10 MG tablet Take 10 mg by mouth daily.    . budesonide (PULMICORT) 180 MCG/ACT inhaler Inhale into the lungs 2 (two) times daily.    . clobetasol ointment (TEMOVATE) 7.68 % Apply 1 application topically 2 (two) times daily.    . enalapril (VASOTEC) 2.5 MG tablet Take 5 mg by mouth daily.     Marland Kitchen esomeprazole (NEXIUM) 40 MG capsule Take 40 mg by mouth daily at 12 noon.    . sertraline (ZOLOFT) 100 MG tablet Take 100 mg by mouth daily.    . tamoxifen (NOLVADEX) 10 MG tablet Take 10 mg by mouth daily.    Marland Kitchen estradiol (ESTRACE) 0.1 MG/GM vaginal cream Place 1 Applicatorful vaginally at bedtime.     No current  facility-administered medications for this encounter.     Physical Findings: The patient is in no acute distress. Patient is alert and oriented.  weight is 153 lb 6 oz (69.6 kg). Her oral temperature is 98.2 F (36.8 C). Her blood pressure is 113/67 and her pulse is 78. Her respiration is 18 and oxygen saturation is 97%. .  Lungs are clear to auscultation bilaterally. Heart has regular rate and rhythm. No palpable cervical, supraclavicular, or axillary adenopathy. Abdomen soft, non-tender, normal bowel sounds. The left breast area shows some mild hyperpigmentation changes. Mild edema in the nipple/areola area. No dominant nipple discharge or bleeding. The right breast is benign  Lab Findings: Lab Results  Component Value Date   WBC 5.4 09/07/2016   HGB 10.8 (L) 09/07/2016   HCT 32.2 (L) 09/07/2016   MCV 95.1 09/07/2016   PLT 148 09/07/2016    Radiographic Findings: No results found.  Impression:  No evidence of recurrence on clinical exam. The patient is tolerating tamoxifen well.   Plan:  When necessary follow-up in radiation onc. She will continue follow-up medical oncology is due for imaging this spring. She continues to follow with surgery.  ____________________________________ Gery Pray, MD

## 2017-03-31 NOTE — Progress Notes (Addendum)
Jeanne Thompson is here for her follow-up appointment.States that she has dull pains in her breast sometime. Denies any fatigue. Denies any issues with her skin. States that she uses radaiplex on her skin sometime. Patient has an appointment with Dr.Magninat on 06/07/2017. Wt Readings from Last 3 Encounters:  03/31/17 153 lb 6 oz (69.6 kg)  10/04/16 159 lb 12.8 oz (72.5 kg)  09/07/16 159 lb 14.4 oz (72.5 kg)   Vitals:   03/31/17 0925  BP: 113/67  Pulse: 78  Resp: 18  Temp: 98.2 F (36.8 C)  TempSrc: Oral  SpO2: 97%  Weight: 153 lb 6 oz (69.6 kg)

## 2017-04-19 ENCOUNTER — Ambulatory Visit
Admission: RE | Admit: 2017-04-19 | Discharge: 2017-04-19 | Disposition: A | Discharge status: Home / Self Care | Payer: Medicare Other | Source: Ambulatory Visit | Attending: Oncology | Admitting: Oncology

## 2017-04-19 DIAGNOSIS — Z170 Estrogen receptor positive status [ER+]: Principal | ICD-10-CM

## 2017-04-19 DIAGNOSIS — C50312 Malignant neoplasm of lower-inner quadrant of left female breast: Secondary | ICD-10-CM

## 2017-04-19 HISTORY — DX: Personal history of irradiation: Z92.3

## 2017-06-03 ENCOUNTER — Telehealth: Payer: Self-pay | Admitting: Oncology

## 2017-06-03 NOTE — Telephone Encounter (Signed)
GM PAL 5/28. Per GM moved lab/fu to [facility] 5/31. Spoke with patient she is aware.

## 2017-06-07 ENCOUNTER — Other Ambulatory Visit: Payer: Medicare Other

## 2017-06-07 ENCOUNTER — Ambulatory Visit: Payer: Medicare Other | Admitting: Oncology

## 2017-06-09 ENCOUNTER — Other Ambulatory Visit: Payer: Self-pay | Admitting: Adult Health

## 2017-06-09 DIAGNOSIS — Z170 Estrogen receptor positive status [ER+]: Principal | ICD-10-CM

## 2017-06-09 DIAGNOSIS — C50312 Malignant neoplasm of lower-inner quadrant of left female breast: Secondary | ICD-10-CM

## 2017-06-10 ENCOUNTER — Encounter: Payer: Self-pay | Admitting: Adult Health

## 2017-06-10 ENCOUNTER — Inpatient Hospital Stay: Payer: Medicare Other | Admitting: Adult Health

## 2017-06-10 ENCOUNTER — Telehealth: Payer: Self-pay | Admitting: Adult Health

## 2017-06-10 ENCOUNTER — Inpatient Hospital Stay: Payer: Medicare Other | Attending: Oncology

## 2017-06-10 VITALS — BP 110/60 | HR 83 | Temp 98.7°F | Resp 18 | Ht 64.0 in | Wt 152.4 lb

## 2017-06-10 DIAGNOSIS — Z79899 Other long term (current) drug therapy: Secondary | ICD-10-CM | POA: Insufficient documentation

## 2017-06-10 DIAGNOSIS — Z170 Estrogen receptor positive status [ER+]: Secondary | ICD-10-CM | POA: Insufficient documentation

## 2017-06-10 DIAGNOSIS — Z923 Personal history of irradiation: Secondary | ICD-10-CM

## 2017-06-10 DIAGNOSIS — Z800 Family history of malignant neoplasm of digestive organs: Secondary | ICD-10-CM | POA: Insufficient documentation

## 2017-06-10 DIAGNOSIS — C50312 Malignant neoplasm of lower-inner quadrant of left female breast: Secondary | ICD-10-CM | POA: Diagnosis not present

## 2017-06-10 DIAGNOSIS — Z79810 Long term (current) use of selective estrogen receptor modulators (SERMs): Secondary | ICD-10-CM | POA: Insufficient documentation

## 2017-06-10 LAB — CMP (CANCER CENTER ONLY)
ALBUMIN: 3.8 g/dL (ref 3.5–5.0)
ALT: 14 U/L (ref 0–55)
ANION GAP: 8 (ref 3–11)
AST: 15 U/L (ref 5–34)
Alkaline Phosphatase: 47 U/L (ref 40–150)
BILIRUBIN TOTAL: 0.2 mg/dL (ref 0.2–1.2)
BUN: 20 mg/dL (ref 7–26)
CO2: 26 mmol/L (ref 22–29)
Calcium: 9.3 mg/dL (ref 8.4–10.4)
Chloride: 107 mmol/L (ref 98–109)
Creatinine: 0.89 mg/dL (ref 0.60–1.10)
GFR, Est AFR Am: >60 mL/min (ref >60)
GLUCOSE: 87 mg/dL (ref 70–140)
POTASSIUM: 3.8 mmol/L (ref 3.5–5.1)
Sodium: 141 mmol/L (ref 136–145)
TOTAL PROTEIN: 6.7 g/dL (ref 6.4–8.3)

## 2017-06-10 LAB — CBC WITH DIFFERENTIAL (CANCER CENTER ONLY)
BASOS PCT: 0 %
Basophils Absolute: 0 10*3/uL (ref 0.0–0.1)
Eosinophils Absolute: 0.2 10*3/uL (ref 0.0–0.5)
Eosinophils Relative: 3 %
HEMATOCRIT: 36.5 % (ref 34.8–46.6)
Hemoglobin: 12.2 g/dL (ref 11.6–15.9)
Lymphocytes Relative: 30 %
Lymphs Abs: 1.5 10*3/uL (ref 0.9–3.3)
MCH: 32 pg (ref 25.1–34.0)
MCHC: 33.4 g/dL (ref 31.5–36.0)
MCV: 95.9 fL (ref 79.5–101.0)
MONO ABS: 0.6 10*3/uL (ref 0.1–0.9)
MONOS PCT: 11 %
NEUTROS ABS: 2.9 10*3/uL (ref 1.5–6.5)
Neutrophils Relative %: 56 %
Platelet Count: 135 10*3/uL — ABNORMAL LOW (ref 145–400)
RBC: 3.8 MIL/uL (ref 3.70–5.45)
RDW: 12.8 % (ref 11.2–14.5)
WBC Count: 5.2 10*3/uL (ref 3.9–10.3)

## 2017-06-10 NOTE — Progress Notes (Signed)
Jeanne Thompson  Telephone:(336) [phone] Fax:(336) [phone]     ID: Jeanne ROSEMAN DOB: January 20, 1940  MR#: [id number]  PJA#:[id number]  Patient Care Team: Jeanne Pac, NP as PCP - General (Internal Medicine) Magrinat, Jeanne Dad, MD as Consulting Physician (Oncology) Jeanne Kussmaul, MD as Consulting Physician (General Surgery) Jeanne Pray, MD as Consulting Physician (Radiation Oncology) Jeanne Amsterdam, MD as Referring Physician (Surgery) Jeanne Alexandria, MD as Referring Physician (Cardiology) Jeanne Bison Charlestine Massed, NP as Nurse Practitioner (Hematology and Oncology) Jeanne Dock, NP OTHER MD:  CHIEF COMPLAINT: Estrogen receptor positive breast cancer  CURRENT TREATMENT: Tamoxifen   BREAST CANCER HISTORY: From the original intake note:   Jeanne Thompson had routine screening mammography 04/05/2016 showing a possible mass in the left breast. She was brought back for left diagnostic mammography and left breast ultrasonography 04/20/2016. The breast density was category C. There was a spiculated mass in the lower inner portion of the left breast. On physical exam this was not palpable. Targeted ultrasound did confirm an irregular mass at the 6:15 o'clock location of the left breast 3 cm from the nipple, measuring 1.7 cm maximally.  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  She continued on tamoxifen right through all her treatments. She does have brief hot flashes. She has minimal vaginal wetness. She obtains a drug at a good price.  INTERVAL HISTORY: Jeanne Thompson returns today for follow-up and treatment of her estrogen receptor positive breast cancer. She continues on anti estrogen therapy with Tamoxifen.  She is  tolerating this well.    REVIEW OF SYSTEMS: She is doing well today.  She has an occasional pain in her left breast at her surgical site.  She is doing well otherwise.  She denies any new pain, nausea, vomiting, diarrhea, constipation, chest pain, palpitations, shortness of breath, cough, breast changes or any other concerns today.    PAST MEDICAL HISTORY: Past Medical History:  Diagnosis Date  . Anxiety   . Arthritis    low back  . Asthma   . Complication of anesthesia   . Depression   . Enlarged heart   . GERD (gastroesophageal reflux disease)   . High cholesterol   . History of radiation therapy 07/06/16-08/20/16   left breast was treated to 50.4 Gy in 28 fractions, left axilla was treated to 45 Gy in 25 fractions, left breast was boosted an additional 10 gy in 5 fractions  . Hypertension   . Personal history of radiation therapy   . PONV (postoperative nausea and vomiting)     PAST SURGICAL HISTORY: Past Surgical History:  Procedure Laterality Date  . ABDOMINAL HYSTERECTOMY     left 1 ovary  . APPENDECTOMY    . BREAST LUMPECTOMY Left    5/18  . BREAST LUMPECTOMY WITH RADIOACTIVE SEED AND SENTINEL LYMPH NODE BIOPSY Left 05/27/2016   Procedure: BREAST LUMPECTOMY WITH RADIOACTIVE SEED AND SENTINEL LYMPH NODE BIOPSY;  Surgeon: Jeanne Kussmaul, MD;  Location: [facility];  Service: General;  Laterality: Left;  . BREAST SURGERY Left    benign  . CHOLECYSTECTOMY    . EYE SURGERY Left    cataract  . PARTIAL HYSTERECTOMY    . THYROIDECTOMY, PARTIAL    . TONSILECTOMY/ADENOIDECTOMY WITH MYRINGOTOMY    .  TONSILLECTOMY      FAMILY HISTORY Family History  Problem Relation Age of Onset  . Breast cancer Mother   . Colon cancer Brother   . Breast cancer Maternal Grandfather   . Breast cancer Paternal Grandmother   The patient's father died from myocardial infarction at age 28 in the setting of tobacco abuse. The patient's mother died at age 27. She had been diagnosed  with breast cancer at age 5 and again at age 58. The patient had one brother, no sisters. The brother had colon cancer diagnosed at age 34. A maternal grandmother was diagnosed with breast cancer in her 32s. A paternal grandmother was diagnosed with breast cancer as well as well as 2 paternal aunts and in addition one paternal cousin was diagnosed with ovarian cancer. The patient herself has been tested for the BRCA mutation in was negative  GYNECOLOGIC HISTORY:  No LMP recorded. Patient has had a hysterectomy. Menarche age 43, first live birth age 31, she is [place] P2. She underwent hysterectomy with unilateral salpingo-oophorectomy age 77. She did not use hormone replacement. She did use oral contraceptives remotely for about 4 years.  SOCIAL HISTORY:  She worked as a Pharmacist, hospital, chiefly in reading, and still works part-time. She is divorced, lives alone with her dog appendectomy. Son today what cans lives in [place] and works in Press photographer. Son Sueko Dimichele lives in [place] and is vice Radio producer and service. The patient has 4 grandchildren by a logically and 2 step grandchildren. She is a [place]    ADVANCED DIRECTIVES: She has the advanced directive papers but has not yet completed or notarize them, as of the 05/05/2016 visit   HEALTH MAINTENANCE: Social History   Tobacco Use  . Smoking status: Never Smoker  . Smokeless tobacco: Never Used  Substance Use Topics  . Alcohol use: Yes    Comment: social  . Drug use: No     Colonoscopy:  PAP:  Bone density:   No Known Allergies  Current Outpatient Medications  Medication Sig Dispense Refill  . albuterol (PROVENTIL HFA;VENTOLIN HFA) 108 (90 Base) MCG/ACT inhaler Inhale 2 puffs into the lungs every 4 (four) hours as needed for wheezing or shortness of breath.    Marland Kitchen atorvastatin (LIPITOR) 10 MG tablet Take 10 mg by mouth daily.    . budesonide (PULMICORT) 180 MCG/ACT inhaler Inhale into the lungs 2 (two) times daily.    .  clobetasol ointment (TEMOVATE) 4.62 % Apply 1 application topically 2 (two) times daily.    . enalapril (VASOTEC) 2.5 MG tablet Take 5 mg by mouth daily.     Marland Kitchen esomeprazole (NEXIUM) 40 MG capsule Take 40 mg by mouth daily at 12 noon.    . sertraline (ZOLOFT) 100 MG tablet Take 100 mg by mouth daily.    . tamoxifen (NOLVADEX) 10 MG tablet Take 10 mg by mouth daily.     No current facility-administered medications for this visit.     OBJECTIVE: Vitals:   06/10/17 0900  BP: 110/60  Pulse: 83  Resp: 18  Temp: 98.7 F (37.1 C)  SpO2: 97%     Body mass index is 26.16 kg/m.    ECOG FS:1 - Symptomatic but completely ambulatory GENERAL: Patient is a well appearing female in no acute distress HEENT:  Sclerae anicteric.  Oropharynx clear and moist. No ulcerations or evidence of oropharyngeal candidiasis. Neck is supple.  NODES:  No cervical, supraclavicular, or axillary lymphadenopathy palpated.  BREAST EXAM:  Right  breast without nodules masses, skin or nipple changes, left breast s/p lumpectomy and radiation, no nodules, masses, skin or nipple changes LUNGS:  Clear to auscultation bilaterally.  No wheezes or rhonchi. HEART:  Regular rate and rhythm. No murmur appreciated. ABDOMEN:  Soft, nontender.  Positive, normoactive bowel sounds. No organomegaly palpated. MSK:  No focal spinal tenderness to palpation. Full range of motion bilaterally in the upper extremities. EXTREMITIES:  No peripheral edema.   SKIN:  Clear with no obvious rashes or skin changes. No nail dyscrasia. NEURO:  Nonfocal. Well oriented.  Appropriate affect.    LAB RESULTS:  CMP     Component Value Date/Time   NA 142 09/07/2016 1258   K 4.1 09/07/2016 1258   CO2 27 09/07/2016 1258   GLUCOSE 95 09/07/2016 1258   BUN 16.0 09/07/2016 1258   CREATININE 0.9 09/07/2016 1258   CALCIUM 9.2 09/07/2016 1258   PROT 6.4 09/07/2016 1258   ALBUMIN 3.3 (L) 09/07/2016 1258   AST 13 09/07/2016 1258   ALT 17 09/07/2016 1258    ALKPHOS 60 09/07/2016 1258   BILITOT 0.24 09/07/2016 1258    No results found for: TOTALPROTELP, ALBUMINELP, A1GS, A2GS, BETS, BETA2SER, GAMS, MSPIKE, SPEI  No results found for: Nils Pyle, [facility]  Lab Results  Component Value Date   WBC 5.2 06/10/2017   NEUTROABS 2.9 06/10/2017   HGB 12.2 06/10/2017   HCT 36.5 06/10/2017   MCV 95.9 06/10/2017   PLT 135 (L) 06/10/2017      Chemistry      Component Value Date/Time   NA 142 09/07/2016 1258   K 4.1 09/07/2016 1258   CO2 27 09/07/2016 1258   BUN 16.0 09/07/2016 1258   CREATININE 0.9 09/07/2016 1258      Component Value Date/Time   CALCIUM 9.2 09/07/2016 1258   ALKPHOS 60 09/07/2016 1258   AST 13 09/07/2016 1258   ALT 17 09/07/2016 1258   BILITOT 0.24 09/07/2016 1258       No results found for: LABCA2  No components found for: [id number]  No results for input(s): INR in the last 168 hours.  Urinalysis No results found for: COLORURINE, APPEARANCEUR, LABSPEC, PHURINE, GLUCOSEU, HGBUR, BILIRUBINUR, KETONESUR, PROTEINUR, UROBILINOGEN, NITRITE, LEUKOCYTESUR   STUDIES:    ELIGIBLE FOR AVAILABLE RESEARCH PROTOCOL: no  ASSESSMENT: 77 y.o. BRCA negative [facility] woman status post left breast lower inner quadrant biopsy 04/29/2016 for a clinical pT1c pN0, tage IA s invasive ductal carcinoma,  grade 2, estrogen and progesterone receptor positive, HER-2 nonamplified, with an MIB-1 of 10%.   (1) tamoxifen started neoadjuvantly 05/05/2016  (2) additional genetics testing pending  (3) Status post left lumpectomy and sentinel lymph node sampling 05/27/2016 for a pT2 pN1, stage IIA invasive ductal carcinoma, grade 1, with negative margins.  (4) Mammaprint read as low risk, indicating no significant chemotherapy benefit.  (5) adjuvant radiation 07/06/2016 - 08/20/2016 Site/dose:    1) The left breast was treated to 50.4 Gy in 28 fractions of 1.8 Gy. 2) The left axilla was treated to 45 Gy in 25  fractions of 1.8 Gy. 3) The left breast was boosted an additional 10 Gy in 5 fractions of 2 Gy, for a total dose of 60.4 Gy to the left breast.  (6) continuing on tamoxifen to complete 5 years   PLAN:  Lamyra is doing well today.  She is clinically and radiographically without any sign of recurrence.  She will continue on Tamoxifen as she is tolerating it well.  I did recommend that she see primary care at least annually for labs and a wellness visit, and that she should do that in a few months.  I recommended routine skin cancer screenings, and colon cancer screening per her PCP and GI.  She is up to date with her screenings.  Healthy diet and regular exercise was reviewed.    I reviewed her CBC with her and the fact that plt count was 135.  Told her we could repeat CBC in a few months, however, I would recommend she see her PCP for a routine wider panel of labs instead.    Siren will return in one year for labs and f/u.  I will send a message to Dr. Marlou Starks, so that we can alternate her appointments.  She knows to call for any problems that may develop before that visit..  A total of (30) minutes of face-to-face time was spent with this patient with greater than 50% of that time in counseling and care-coordination.   Jeanne Dock, NP   06/10/2017 9:29 AM Medical Oncology and Hematology [facility] [address] [place], [place] [address] Tel. [phone]    Fax. [phone]

## 2017-06-10 NOTE — Telephone Encounter (Signed)
Gave patient AVs and calendar of upcoming may 2020 appointments °

## 2017-07-22 ENCOUNTER — Other Ambulatory Visit: Payer: Self-pay | Admitting: Oncology

## 2017-10-04 ENCOUNTER — Ambulatory Visit: Payer: Medicare Other | Admitting: Radiation Oncology

## 2018-06-07 ENCOUNTER — Encounter: Payer: Self-pay | Admitting: Oncology

## 2018-06-07 ENCOUNTER — Other Ambulatory Visit: Payer: Self-pay | Admitting: Oncology

## 2018-06-07 DIAGNOSIS — C50312 Malignant neoplasm of lower-inner quadrant of left female breast: Secondary | ICD-10-CM

## 2018-06-08 ENCOUNTER — Inpatient Hospital Stay: Payer: Medicare Other | Admitting: Oncology

## 2018-06-08 ENCOUNTER — Inpatient Hospital Stay: Payer: Medicare Other

## 2018-06-12 ENCOUNTER — Other Ambulatory Visit: Payer: Self-pay

## 2018-06-12 ENCOUNTER — Inpatient Hospital Stay: Payer: Medicare Other | Attending: Oncology

## 2018-06-12 ENCOUNTER — Encounter: Payer: Self-pay | Admitting: Adult Health

## 2018-06-12 ENCOUNTER — Inpatient Hospital Stay: Payer: Medicare Other | Admitting: Adult Health

## 2018-06-12 VITALS — BP 136/65 | HR 78 | Temp 98.3°F | Resp 19 | Ht 64.0 in | Wt 154.5 lb

## 2018-06-12 DIAGNOSIS — Z923 Personal history of irradiation: Secondary | ICD-10-CM | POA: Insufficient documentation

## 2018-06-12 DIAGNOSIS — I10 Essential (primary) hypertension: Secondary | ICD-10-CM | POA: Insufficient documentation

## 2018-06-12 DIAGNOSIS — R232 Flushing: Secondary | ICD-10-CM | POA: Diagnosis not present

## 2018-06-12 DIAGNOSIS — Z7951 Long term (current) use of inhaled steroids: Secondary | ICD-10-CM | POA: Insufficient documentation

## 2018-06-12 DIAGNOSIS — Z803 Family history of malignant neoplasm of breast: Secondary | ICD-10-CM | POA: Diagnosis not present

## 2018-06-12 DIAGNOSIS — K219 Gastro-esophageal reflux disease without esophagitis: Secondary | ICD-10-CM | POA: Diagnosis not present

## 2018-06-12 DIAGNOSIS — C50312 Malignant neoplasm of lower-inner quadrant of left female breast: Secondary | ICD-10-CM | POA: Insufficient documentation

## 2018-06-12 DIAGNOSIS — Z79811 Long term (current) use of aromatase inhibitors: Secondary | ICD-10-CM

## 2018-06-12 DIAGNOSIS — Z170 Estrogen receptor positive status [ER+]: Secondary | ICD-10-CM

## 2018-06-12 DIAGNOSIS — Z79899 Other long term (current) drug therapy: Secondary | ICD-10-CM

## 2018-06-12 LAB — CBC WITH DIFFERENTIAL (CANCER CENTER ONLY)
Abs Immature Granulocytes: 0.03 10*3/uL (ref 0.00–0.07)
Basophils Absolute: 0 10*3/uL (ref 0.0–0.1)
Basophils Relative: 0 %
Eosinophils Absolute: 0.1 10*3/uL (ref 0.0–0.5)
Eosinophils Relative: 2 %
HCT: 35.5 % — ABNORMAL LOW (ref 36.0–46.0)
Hemoglobin: 11.3 g/dL — ABNORMAL LOW (ref 12.0–15.0)
Immature Granulocytes: 1 %
Lymphocytes Relative: 30 %
Lymphs Abs: 1.8 10*3/uL (ref 0.7–4.0)
MCH: 31.8 pg (ref 26.0–34.0)
MCHC: 31.8 g/dL (ref 30.0–36.0)
MCV: 100 fL (ref 80.0–100.0)
Monocytes Absolute: 0.6 10*3/uL (ref 0.1–1.0)
Monocytes Relative: 10 %
Neutro Abs: 3.4 10*3/uL (ref 1.7–7.7)
Neutrophils Relative %: 57 %
Platelet Count: 129 10*3/uL — ABNORMAL LOW (ref 150–400)
RBC: 3.55 MIL/uL — ABNORMAL LOW (ref 3.87–5.11)
RDW: 12 % (ref 11.5–15.5)
WBC Count: 5.9 10*3/uL (ref 4.0–10.5)
nRBC: 0 % (ref 0.0–0.2)

## 2018-06-12 LAB — CMP (CANCER CENTER ONLY)
ALT: 15 U/L (ref 0–44)
AST: 17 U/L (ref 15–41)
Albumin: 3.7 g/dL (ref 3.5–5.0)
Alkaline Phosphatase: 52 U/L (ref 38–126)
Anion gap: 6 (ref 5–15)
BUN: 16 mg/dL (ref 8–23)
CO2: 27 mmol/L (ref 22–32)
Calcium: 8.6 mg/dL — ABNORMAL LOW (ref 8.9–10.3)
Chloride: 109 mmol/L (ref 98–111)
Creatinine: 0.84 mg/dL (ref 0.44–1.00)
GFR, Est AFR Am: >60 mL/min (ref >60)
GFR, Estimated: >60 mL/min (ref >60)
Glucose, Bld: 92 mg/dL (ref 70–99)
Potassium: 4 mmol/L (ref 3.5–5.1)
Sodium: 142 mmol/L (ref 135–145)
Total Bilirubin: 0.2 mg/dL — ABNORMAL LOW (ref 0.3–1.2)
Total Protein: 6.6 g/dL (ref 6.5–8.1)

## 2018-06-12 NOTE — Patient Instructions (Signed)

## 2018-06-12 NOTE — Progress Notes (Signed)
CLINIC:  Survivorship   REASON FOR VISIT:  Routine follow-up for history of breast cancer.   BRIEF ONCOLOGIC HISTORY:    Malignant neoplasm of lower-inner quadrant of left breast in female, estrogen receptor positive ([facility])   04/29/2016 Initial Biopsy    Left breast lower inner quadrant biopsy: IDC, grade 2, ER+(100%), PR+(100%), Ki-67 10%, HER-2 negative (ratio1.28).     05/04/2016 Initial Diagnosis    Malignant neoplasm of lower-inner quadrant of left breast in female, estrogen receptor positive ([facility])    05/05/2016 -  Anti-estrogen oral therapy    Tamoxifen daily    05/27/2016 Surgery    Left lumpectomy and SLNB: IDC, grade 1, 2.4 cm, DCIS, perineural invasion, 1/2 SLN +, margins neg, T2, N1a.  MAMMAPRINT Low risk    07/06/2016 - 08/20/2016 Radiation Therapy    1) The left breast was treated to 50.4 Gy in 28 fractions of 1.8 Gy.  2) The left axilla was treated to 45 Gy in 25 fractions of 1.8 Gy.  3) The left breast was boosted an additional 10 Gy in 5 fractions of 2 Gy, for a total dose of 60.4 Gy to the left breast.        INTERVAL HISTORY:  Jeanne Thompson presents to the Survivorship Clinic today for routine follow-up for her history of breast cancer.  Overall, she reports feeling quite well. Jeanne Thompson is taking Tamoxifen daily.  She has occasional hot flashes and these are tolerable.  She notes that she is having them about 1-2 times a day, and sometimes not at all.  She says these are improving then how they were initially. She denies arthralgias, vaginal discharge.   She sees her PCP, Marrianne Mood NP regularly.  She is up to date with colon cancer screening and skin cancer screening.  She is s/p TAH.  She no longer requires GYN screening.  She cannot recall when her last bone density test was, or the results.    Jeanne Thompson is exercising regularly, three times per week.  She is walking for about 30 minutes an occasion.  She does not follow a "special diet"  She notes she does try to eat the  right things.  She denies any new medical conditions since her last appointments.     REVIEW OF SYSTEMS:  Review of Systems  Constitutional: Negative for appetite change, chills, fatigue, fever and unexpected weight change.  HENT:   Negative for hearing loss, lump/mass, mouth sores and trouble swallowing.   Eyes: Negative for eye problems and icterus.  Respiratory: Negative for chest tightness, cough and shortness of breath.   Cardiovascular: Negative for chest pain, leg swelling and palpitations.  Gastrointestinal: Negative for abdominal distention, abdominal pain, constipation, diarrhea, nausea and vomiting.  Endocrine: Negative for hot flashes.  Musculoskeletal: Negative for arthralgias.  Skin: Negative for itching and rash.  Neurological: Negative for dizziness, extremity weakness, headaches and numbness.  Hematological: Negative for adenopathy. Does not bruise/bleed easily.  Psychiatric/Behavioral: Negative for depression. The patient is not nervous/anxious.    Breast: Denies any new nodularity, masses, tenderness, nipple changes, or nipple discharge.       PAST MEDICAL/SURGICAL HISTORY:  Past Medical History:  Diagnosis Date  . Anxiety   . Arthritis    low back  . Asthma   . Complication of anesthesia   . Depression   . Enlarged heart   . GERD (gastroesophageal reflux disease)   . High cholesterol   . History of radiation therapy 07/06/16-08/20/16   left  breast was treated to 50.4 Gy in 28 fractions, left axilla was treated to 45 Gy in 25 fractions, left breast was boosted an additional 10 gy in 5 fractions  . Hypertension   . Personal history of radiation therapy   . PONV (postoperative nausea and vomiting)    Past Surgical History:  Procedure Laterality Date  . ABDOMINAL HYSTERECTOMY     left 1 ovary  . APPENDECTOMY    . BREAST LUMPECTOMY Left    5/18  . BREAST LUMPECTOMY WITH RADIOACTIVE SEED AND SENTINEL LYMPH NODE BIOPSY Left 05/27/2016   Procedure: BREAST  LUMPECTOMY WITH RADIOACTIVE SEED AND SENTINEL LYMPH NODE BIOPSY;  Surgeon: Jovita Kussmaul, MD;  Location: [facility];  Service: General;  Laterality: Left;  . BREAST SURGERY Left    benign  . CHOLECYSTECTOMY    . EYE SURGERY Left    cataract  . PARTIAL HYSTERECTOMY    . THYROIDECTOMY, PARTIAL    . TONSILECTOMY/ADENOIDECTOMY WITH MYRINGOTOMY    . TONSILLECTOMY       ALLERGIES:  No Known Allergies   CURRENT MEDICATIONS:  Outpatient Encounter Medications as of 06/12/2018  Medication Sig  . albuterol (PROVENTIL HFA;VENTOLIN HFA) 108 (90 Base) MCG/ACT inhaler Inhale 2 puffs into the lungs every 4 (four) hours as needed for wheezing or shortness of breath.  Marland Kitchen atorvastatin (LIPITOR) 10 MG tablet Take 10 mg by mouth daily.  . budesonide (PULMICORT) 180 MCG/ACT inhaler Inhale into the lungs 2 (two) times daily.  . clobetasol ointment (TEMOVATE) 4.31 % Apply 1 application topically 2 (two) times daily.  Marland Kitchen esomeprazole (NEXIUM) 40 MG capsule Take 40 mg by mouth daily at 12 noon.  . sertraline (ZOLOFT) 100 MG tablet Take 100 mg by mouth daily.  . tamoxifen (NOLVADEX) 20 MG tablet TAKE 1 TABLET BY MOUTH EVERY DAY  . [DISCONTINUED] enalapril (VASOTEC) 2.5 MG tablet Take 5 mg by mouth daily.   . [DISCONTINUED] tamoxifen (NOLVADEX) 10 MG tablet Take 10 mg by mouth daily.   No facility-administered encounter medications on file as of 06/12/2018.      ONCOLOGIC FAMILY HISTORY:  Family History  Problem Relation Age of Onset  . Breast cancer Mother 65       again at age 68  . Heart attack Father        tobacco abuse  . Colon cancer Brother 62  . Breast cancer Paternal Grandmother   . Breast cancer Paternal Aunt   . Breast cancer Maternal Grandmother        40s  . Ovarian cancer Cousin     GENETIC COUNSELING/TESTING: Tested years ago at [facility], in 1991. I recommended retesting, and she would like to think about it further and will call us back if she decides to undergo  testing.    SOCIAL HISTORY:  Jeanne Thompson has a home in [place].  She has been living with her significant other in [place], who has been unfortunately recently transferred to inpatient hospice.  She is retired.  She is practicing social distancing.     PHYSICAL EXAMINATION:  Vital Signs: Vitals:   06/12/18 1121  BP: 136/65  Pulse: 78  Resp: 19  Temp: 98.3 F (36.8 C)  SpO2: 99%   Filed Weights   06/12/18 1121  Weight: 154 lb 8 oz (70.1 kg)   General: Well-nourished, well-appearing female in no acute distress.  Unaccompanied today.   HEENT: Head is normocephalic.  Pupils equal and reactive to light. Conjunctivae clear without exudate.  Sclerae anicteric. Oral mucosa is pink, moist.  Oropharynx is pink without lesions or erythema.  Lymph: No cervical, supraclavicular, or infraclavicular lymphadenopathy noted on palpation.  Cardiovascular: Regular rate and rhythm.Marland Kitchen Respiratory: Clear to auscultation bilaterally. Chest expansion symmetric; breathing non-labored.  Breast Exam:  -Left breast: No appreciable masses on palpation. No skin redness, thickening, or peau d'orange appearance; no nipple retraction or nipple discharge; mild distortion in symmetry at previous lumpectomy site wellhealed scar without erythema or nodularity.  -Right breast: No appreciable masses on palpation. No skin redness, thickening, or peau d'orange appearance; no nipple retraction or nipple discharge. -Axilla: No axillary adenopathy bilaterally.  GI: Abdomen soft and round; non-tender, non-distended. Bowel sounds normoactive. No hepatosplenomegaly.   GU: Deferred.  Neuro: No focal deficits. Steady gait.  Psych: Mood and affect normal and appropriate for situation.  MSK: No focal spinal tenderness to palpation, full range of motion in bilateral upper extremities Extremities: No edema. Skin: Warm and dry.  LABORATORY DATA:  Appointment on 06/12/2018  Component Date Value Ref Range Status  . Sodium 06/12/2018  142  135 - 145 mmol/L Final  . Potassium 06/12/2018 4.0  3.5 - 5.1 mmol/L Final  . Chloride 06/12/2018 109  98 - 111 mmol/L Final  . CO2 06/12/2018 27  22 - 32 mmol/L Final  . Glucose, Bld 06/12/2018 92  70 - 99 mg/dL Final  . BUN 06/12/2018 16  8 - 23 mg/dL Final  . Creatinine 06/12/2018 0.84  0.44 - 1.00 mg/dL Final  . Calcium 06/12/2018 8.6* 8.9 - 10.3 mg/dL Final  . Total Protein 06/12/2018 6.6  6.5 - 8.1 g/dL Final  . Albumin 06/12/2018 3.7  3.5 - 5.0 g/dL Final  . AST 06/12/2018 17  15 - 41 U/L Final  . ALT 06/12/2018 15  0 - 44 U/L Final  . Alkaline Phosphatase 06/12/2018 52  38 - 126 U/L Final  . Total Bilirubin 06/12/2018 0.2* 0.3 - 1.2 mg/dL Final  . GFR, Est Non Af Am 06/12/2018 >60  >60 mL/min Final  . GFR, Est AFR Am 06/12/2018 >60  >60 mL/min Final  . Anion gap 06/12/2018 6  5 - 15 Final   Performed at [facility], [place] [address] [place], [place] [address]  . WBC Count 06/12/2018 5.9  4.0 - 10.5 K/uL Final  . RBC 06/12/2018 3.55* 3.87 - 5.11 MIL/uL Final  . Hemoglobin 06/12/2018 11.3* 12.0 - 15.0 g/dL Final  . HCT 06/12/2018 35.5* 36.0 - 46.0 % Final  . MCV 06/12/2018 100.0  80.0 - 100.0 fL Final  . MCH 06/12/2018 31.8  26.0 - 34.0 pg Final  . MCHC 06/12/2018 31.8  30.0 - 36.0 g/dL Final  . RDW 06/12/2018 12.0  11.5 - 15.5 % Final  . Platelet Count 06/12/2018 129* 150 - 400 K/uL Final  . nRBC 06/12/2018 0.0  0.0 - 0.2 % Final  . Neutrophils Relative % 06/12/2018 57  % Final  . Neutro Abs 06/12/2018 3.4  1.7 - 7.7 K/uL Final  . Lymphocytes Relative 06/12/2018 30  % Final  . Lymphs Abs 06/12/2018 1.8  0.7 - 4.0 K/uL Final  . Monocytes Relative 06/12/2018 10  % Final  . Monocytes Absolute 06/12/2018 0.6  0.1 - 1.0 K/uL Final  . Eosinophils Relative 06/12/2018 2  % Final  . Eosinophils Absolute 06/12/2018 0.1  0.0 - 0.5 K/uL Final  . Basophils Relative 06/12/2018 0  % Final  . Basophils Absolute 06/12/2018 0.0  0.0 - 0.1  K/uL Final  .  Immature Granulocytes 06/12/2018 1  % Final  . Abs Immature Granulocytes 06/12/2018 0.03  0.00 - 0.07 K/uL Final   Performed at [facility], [place] [address], [place], [place] [address]     DIAGNOSTIC IMAGING:  Most recent mammogram: delayed due to COVID 19, scheduled on 06/22/2018    ASSESSMENT AND PLAN:  Ms.. Jeanne Thompson is a pleasant 78 y.o. female with history of Stage IA left breast invasive ductal carcinoma, ER+/PR+/HER2-, diagnosed in 04/2016, treated with lumpectomy, adjuvant radiation therapy, and anti-estrogen therapy with Tamoxifen beginning in 04/2016.  She presents to the Survivorship Clinic for surveillance and routine follow-up.   1. History of breast cancer:  Ms. Jeanne Thompson is currently clinically and radiographically without evidence of disease or recurrence of breast cancer. She will be due for mammogram in 06/2018 (delayed due to Jeanne Thompson).  She will continue her anti-estrogen therapy with Tamoxifen, with plans to continue for 5-10 years.  She will return to the cancer center to see her medical oncologist, Dr. Jana Hakim in 06/2019.  I encouraged her to call me with any questions or concerns before her next visit at the cancer center, and I would be happy to see her sooner, if needed.    2. Strong family history of breast cancer: I recommended she consider genetic retesting.  I informed her of the process and potential risk/benefits.  She would like to think about this further and will call our office if she would like to be set up.    3. Bone health:  Given Ms. Jeanne Thompson's age, history of breast cancer, she is at risk for bone demineralization. I counseled her that Tamoxifen is protective on her bones.  I recommended she f/u with her PCP to see when her next bone density testing is due.  She was given education on specific food and activities to promote bone health.  4. Cancer screening:  Due to Ms. Jeanne Thompson's history and her age, she should receive screening for skin  cancers, colon cancer. She was encouraged to follow-up with her PCP for appropriate cancer screenings.   5. Health maintenance and wellness promotion: Ms. Jeanne Thompson was encouraged to consume 5-7 servings of fruits and vegetables per day. She was also encouraged to engage in moderate to vigorous exercise for 30 minutes per day most days of the week. She was instructed to limit her alcohol consumption and continue to abstain from tobacco use.    Dispo:  -Return to cancer center in one year for f/u with Dr. Jana Hakim -Mammogram due in 06/2018   A total of (30) minutes of face-to-face time was spent with this patient with greater than 50% of that time in counseling and care-coordination.   Gardenia Phlegm, NP Survivorship Program [facility] [phone]   Note: PRIMARY CARE PROVIDER Marrianne Mood [place], [place] [phone]

## 2018-06-22 ENCOUNTER — Other Ambulatory Visit: Payer: Self-pay

## 2018-06-22 ENCOUNTER — Ambulatory Visit
Admission: RE | Admit: 2018-06-22 | Discharge: 2018-06-22 | Disposition: A | Discharge status: Home / Self Care | Payer: Medicare Other | Source: Ambulatory Visit | Attending: Adult Health | Admitting: Adult Health

## 2018-06-22 DIAGNOSIS — C50312 Malignant neoplasm of lower-inner quadrant of left female breast: Secondary | ICD-10-CM

## 2018-07-07 ENCOUNTER — Other Ambulatory Visit: Payer: Self-pay | Admitting: Oncology

## 2019-06-11 NOTE — Progress Notes (Signed)
[name]  [phone] [phone]     ID: Jeanne Thompson DOB: 12-09-1940  MR#: [id number]  IRW#:[id number]  Patient Care Team: Gennette Pac, NP as PCP - General (Internal Medicine) Taysha Majewski, Virgie Dad, MD as Consulting Physician (Oncology) Jovita Kussmaul, MD as Consulting Physician (General Surgery) Gery Pray, MD as Consulting Physician (Radiation Oncology) Abbe Amsterdam, MD as Referring Physician (Surgery) Delice Bison Charlestine Massed, NP as Nurse Practitioner (Hematology and Oncology) Marcene Brawn, MD as Consulting Physician (Cardiology) Chauncey Cruel, MD OTHER MD:  CHIEF COMPLAINT: Estrogen receptor positive breast cancer  CURRENT TREATMENT: Tamoxifen   INTERVAL HISTORY: Jeanne Thompson returns today for follow-up of her estrogen receptor positive breast cancer.   She continues on anti estrogen therapy with Tamoxifen.  She is tolerating this well.    Since her last visit, she underwent bilateral diagnostic mammography with tomography at [facility] in [place].  This found the breast density to be category C.  There was no evidence of malignancy.   REVIEW OF SYSTEMS: Haeli is very concerned about her grandson, now 77 years old, who has been living with her.  Currently he is in [place] temporarily.  She has had her Covid vaccines and tolerated them well.  She did inherit a puppy from her grandson and she has some bruises because of puppy is a little too friendly right now.  Aside from these issues a detailed review of systems today was stable   BREAST CANCER HISTORY: From the original intake note:   Jeanne Thompson had routine screening mammography 04/05/2016 showing a possible mass in the left breast. She was brought back for left diagnostic mammography and left breast ultrasonography 04/20/2016. The breast density was category C. There was a spiculated mass in the lower inner portion of the left breast. On physical exam this was not palpable.  Targeted ultrasound did confirm an irregular mass at the 6:15 o'clock location of the left breast 3 cm from the nipple, measuring 1.7 cm maximally.  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  She continued on tamoxifen right through all her treatments.    PAST MEDICAL HISTORY: Past Medical History:  Diagnosis Date  . Anxiety   . Arthritis    low back  . Asthma   . Complication of anesthesia   . Depression   . Enlarged heart   . GERD (gastroesophageal reflux disease)   . High cholesterol   . History of radiation therapy 07/06/16-08/20/16   left breast was treated to 50.4 Gy in 28 fractions, left axilla was treated to 45 Gy in 25 fractions, left breast was boosted an additional 10 gy in 5 fractions  . Hypertension   . Personal history of radiation therapy   . PONV (postoperative nausea and vomiting)     PAST SURGICAL HISTORY: Past Surgical History:  Procedure Laterality Date  . ABDOMINAL HYSTERECTOMY     left 1 ovary  . APPENDECTOMY    . BREAST LUMPECTOMY Left    5/18  . BREAST LUMPECTOMY WITH RADIOACTIVE SEED AND SENTINEL LYMPH NODE BIOPSY Left 05/27/2016   Procedure: BREAST LUMPECTOMY WITH RADIOACTIVE SEED AND SENTINEL LYMPH NODE BIOPSY;  Surgeon: Jovita Kussmaul, MD;  Location: [facility];  Service: General;  Laterality:  Left;  . BREAST SURGERY Left    benign  . CHOLECYSTECTOMY    . EYE SURGERY Left    cataract  . PARTIAL HYSTERECTOMY    . THYROIDECTOMY, PARTIAL    . TONSILECTOMY/ADENOIDECTOMY WITH MYRINGOTOMY    . TONSILLECTOMY      FAMILY HISTORY Family History  Problem Relation Age of Onset  . Breast cancer Mother 72       again at age 68  . Heart attack Father        tobacco abuse  .  Colon cancer Brother 40  . Breast cancer Paternal Grandmother   . Breast cancer Paternal Aunt   . Breast cancer Maternal Grandmother        21s  . Ovarian cancer Cousin   The patient's father died from myocardial infarction at age 61 in the setting of tobacco abuse. The patient's mother died at age 46. She had been diagnosed with breast cancer at age 19 and again at age 35. The patient had one brother, no sisters. The brother had colon cancer diagnosed at age 33. A maternal grandmother was diagnosed with breast cancer in her 63s. A paternal grandmother was diagnosed with breast cancer as well as well as 2 paternal aunts and in addition one paternal cousin was diagnosed with ovarian cancer. The patient herself has been tested for the BRCA mutation in was negative   GYNECOLOGIC HISTORY:  No LMP recorded. Patient has had a hysterectomy. Menarche age 60, first live birth age 46, she is [place] P2. She underwent hysterectomy with unilateral salpingo-oophorectomy age 42. She did not use hormone replacement. She did use oral contraceptives remotely for about 4 years.   SOCIAL HISTORY:  She worked as a Pharmacist, hospital, chiefly in reading, and still works part-time. She is divorced, lives alone with her dog . Son         Scrivens lives in [place] and works in Press photographer. Son Thersea Manfredonia lives in [place] and is vice Radio producer and service. The patient has 4 grandchildren biologically and 2 step grandchildren. She is a [place]    ADVANCED DIRECTIVES: She has the advanced directive papers but has not yet completed or notarize them, as of the 05/06/1975 visit; she tells me (06/12/2018) she intends to name both her sons as joint healthcare power of attorney's  HEALTH MAINTENANCE: Social History   Tobacco Use  . Smoking status: Never Smoker  . Smokeless tobacco: Never Used  Substance Use Topics  . Alcohol use: Yes    Comment: social  . Drug use: No     Colonoscopy:  PAP:  Bone density:   No  Known Allergies  Current Outpatient Medications  Medication Sig Dispense Refill  . albuterol (PROVENTIL HFA;VENTOLIN HFA) 108 (90 Base) MCG/ACT inhaler Inhale 2 puffs into the lungs every 4 (four) hours as needed for wheezing or shortness of breath.    Marland Kitchen atorvastatin (LIPITOR) 10 MG tablet Take 10 mg by mouth daily.    . budesonide (PULMICORT) 180 MCG/ACT inhaler Inhale into the lungs 2 (two) times daily.    . clobetasol ointment (TEMOVATE) 1.00 % Apply 1 application topically 2 (two) times daily.    Marland Kitchen esomeprazole (NEXIUM) 40 MG capsule Take 40 mg by mouth daily at 12 noon.    . sertraline (ZOLOFT) 100 MG tablet Take 100 mg by mouth daily.    . tamoxifen (NOLVADEX) 20 MG tablet TAKE 1 TABLET BY MOUTH EVERY DAY 90 tablet 3   No current facility-administered  medications for this visit.    OBJECTIVE: White woman in no acute distress Vitals:   06/12/19 1138  BP: 131/73  Pulse: 78  Resp: 18  Temp: 98.5 F (36.9 C)  SpO2: 99%     Body mass index is 26.47 kg/m.    ECOG FS:1 - Symptomatic but completely ambulatory  Sclerae unicteric, EOMs intact Wearing a mask No cervical or supraclavicular adenopathy Lungs no rales or rhonchi Heart regular rate and rhythm Abd soft, nontender, positive bowel sounds MSK no focal spinal tenderness, no upper extremity lymphedema Neuro: nonfocal, well oriented, appropriate affect Breasts: The right breast is unremarkable.  The left breast is status post lumpectomy and radiation.  There is no evidence of local recurrence.  Both axillae are benign.   LAB RESULTS:  CMP     Component Value Date/Time   NA 141 06/12/2019 1115   NA 142 09/07/2016 1258   K 4.4 06/12/2019 1115   K 4.1 09/07/2016 1258   CL 107 06/12/2019 1115   CO2 24 06/12/2019 1115   CO2 27 09/07/2016 1258   GLUCOSE 96 06/12/2019 1115   GLUCOSE 95 09/07/2016 1258   BUN 25 (H) 06/12/2019 1115   BUN 16.0 09/07/2016 1258   CREATININE 1.16 (H) 06/12/2019 1115   CREATININE 0.9  09/07/2016 1258   CALCIUM 8.9 06/12/2019 1115   CALCIUM 9.2 09/07/2016 1258   PROT 6.1 (L) 06/12/2019 1115   PROT 6.4 09/07/2016 1258   ALBUMIN 3.5 06/12/2019 1115   ALBUMIN 3.3 (L) 09/07/2016 1258   AST 16 06/12/2019 1115   AST 13 09/07/2016 1258   ALT 18 06/12/2019 1115   ALT 17 09/07/2016 1258   ALKPHOS 56 06/12/2019 1115   ALKPHOS 60 09/07/2016 1258   BILITOT 0.3 06/12/2019 1115   BILITOT 0.24 09/07/2016 1258   GFRNONAA 45 (L) 06/12/2019 1115   GFRAA 53 (L) 06/12/2019 1115    No results found for: TOTALPROTELP, ALBUMINELP, A1GS, A2GS, BETS, BETA2SER, GAMS, MSPIKE, SPEI  No results found for: Nils Pyle, [facility]  Lab Results  Component Value Date   WBC 5.7 06/12/2019   NEUTROABS 3.1 06/12/2019   HGB 11.0 (L) 06/12/2019   HCT 33.9 (L) 06/12/2019   MCV 97.7 06/12/2019   PLT 122 (L) 06/12/2019      Chemistry      Component Value Date/Time   NA 141 06/12/2019 1115   NA 142 09/07/2016 1258   K 4.4 06/12/2019 1115   K 4.1 09/07/2016 1258   CL 107 06/12/2019 1115   CO2 24 06/12/2019 1115   CO2 27 09/07/2016 1258   BUN 25 (H) 06/12/2019 1115   BUN 16.0 09/07/2016 1258   CREATININE 1.16 (H) 06/12/2019 1115   CREATININE 0.9 09/07/2016 1258      Component Value Date/Time   CALCIUM 8.9 06/12/2019 1115   CALCIUM 9.2 09/07/2016 1258   ALKPHOS 56 06/12/2019 1115   ALKPHOS 60 09/07/2016 1258   AST 16 06/12/2019 1115   AST 13 09/07/2016 1258   ALT 18 06/12/2019 1115   ALT 17 09/07/2016 1258   BILITOT 0.3 06/12/2019 1115   BILITOT 0.24 09/07/2016 1258       No results found for: LABCA2  No components found for: [id number]  No results for input(s): INR in the last 168 hours.  Urinalysis No results found for: COLORURINE, APPEARANCEUR, LABSPEC, PHURINE, GLUCOSEU, HGBUR, BILIRUBINUR, KETONESUR, PROTEINUR, UROBILINOGEN, NITRITE, LEUKOCYTESUR   STUDIES:  No results found.   ELIGIBLE FOR AVAILABLE RESEARCH PROTOCOL: no  ASSESSMENT: 79 y.o.  BRCA negative [facility] woman status post left breast lower inner quadrant biopsy 04/29/2016 for a clinical pT1c pN0, tage IA s invasive ductal carcinoma,  grade 2, estrogen and progesterone receptor positive, HER-2 nonamplified, with an MIB-1 of 10%.   (1) tamoxifen started neoadjuvantly 05/05/2016  (2) additional genetics testing pending  (3) Status post left lumpectomy and sentinel lymph node sampling 05/27/2016 for a pT2 pN1, stage IIA invasive ductal carcinoma, grade 1, with negative margins.  (4) Mammaprint read as low risk, indicating no significant chemotherapy benefit.  (5) adjuvant radiation 07/06/2016 - 08/20/2016 Site/dose:    1) The left breast was treated to 50.4 Gy in 28 fractions of 1.8 Gy. 2) The left axilla was treated to 45 Gy in 25 fractions of 1.8 Gy. 3) The left breast was boosted an additional 10 Gy in 5 fractions of 2 Gy, for a total dose of 60.4 Gy to the left breast.  (6) continuing on tamoxifen to complete 5 years   PLAN: [place] is now just over 3 years out from definitive surgery for her breast cancer with no evidence of disease recurrence.  This is very favorable.  She is tolerating tamoxifen well and the plan is to continue that a total of 5 years.  I went over her kidney function which is a little bit down.  I suggested she increase her fluid intake daily.  She does have some bladder issues as well and I suggested she void every 2 hours and keep her bladder on the empty side.    She will follow up with her primary care physician Dr.Hoffman regarding the change in her creatinine but right now I do not see any nephrotoxic medicine since she is not taking any NSAID S  I am hoping her grandsons problem well improved.  She will see me again in 1 year.  She knows to call for any other issue that may develop before that visit.  Total encounter time 25 minutes.Chauncey Cruel, MD   06/12/2019 11:56 AM Medical Oncology and Hematology [facility] [place], [place] [address] Tel. [phone]    Fax. [phone]   I, Wilburn Mylar, am acting as scribe for Dr. Virgie Dad. Kaili Castille.  I, Lurline Del MD, have reviewed the above documentation for accuracy and completeness, and I agree with the above.    *Total Encounter Time as defined by the Centers for Medicare and Medicaid Services includes, in addition to the face-to-face time of a patient visit (documented in the note above) non-face-to-face time: obtaining and reviewing outside history, ordering and reviewing medications, tests or procedures, care coordination (communications with other health care professionals or caregivers) and documentation in the medical record.

## 2019-06-12 ENCOUNTER — Other Ambulatory Visit: Payer: Self-pay

## 2019-06-12 ENCOUNTER — Inpatient Hospital Stay: Payer: Medicare PPO

## 2019-06-12 ENCOUNTER — Inpatient Hospital Stay: Payer: Medicare PPO | Attending: Oncology | Admitting: Oncology

## 2019-06-12 VITALS — BP 131/73 | HR 78 | Temp 98.5°F | Resp 18 | Ht 64.0 in | Wt 154.2 lb

## 2019-06-12 DIAGNOSIS — Z79810 Long term (current) use of selective estrogen receptor modulators (SERMs): Secondary | ICD-10-CM | POA: Diagnosis not present

## 2019-06-12 DIAGNOSIS — Z8249 Family history of ischemic heart disease and other diseases of the circulatory system: Secondary | ICD-10-CM | POA: Diagnosis not present

## 2019-06-12 DIAGNOSIS — C50312 Malignant neoplasm of lower-inner quadrant of left female breast: Secondary | ICD-10-CM | POA: Diagnosis present

## 2019-06-12 DIAGNOSIS — Z923 Personal history of irradiation: Secondary | ICD-10-CM | POA: Insufficient documentation

## 2019-06-12 DIAGNOSIS — Z800 Family history of malignant neoplasm of digestive organs: Secondary | ICD-10-CM | POA: Diagnosis not present

## 2019-06-12 DIAGNOSIS — Z803 Family history of malignant neoplasm of breast: Secondary | ICD-10-CM | POA: Diagnosis not present

## 2019-06-12 DIAGNOSIS — Z79899 Other long term (current) drug therapy: Secondary | ICD-10-CM | POA: Insufficient documentation

## 2019-06-12 DIAGNOSIS — Z170 Estrogen receptor positive status [ER+]: Secondary | ICD-10-CM

## 2019-06-12 DIAGNOSIS — Z8041 Family history of malignant neoplasm of ovary: Secondary | ICD-10-CM | POA: Insufficient documentation

## 2019-06-12 LAB — CMP (CANCER CENTER ONLY)
ALT: 18 U/L (ref 0–44)
AST: 16 U/L (ref 15–41)
Albumin: 3.5 g/dL (ref 3.5–5.0)
Alkaline Phosphatase: 56 U/L (ref 38–126)
Anion gap: 10 (ref 5–15)
BUN: 25 mg/dL — ABNORMAL HIGH (ref 8–23)
CO2: 24 mmol/L (ref 22–32)
Calcium: 8.9 mg/dL (ref 8.9–10.3)
Chloride: 107 mmol/L (ref 98–111)
Creatinine: 1.16 mg/dL — ABNORMAL HIGH (ref 0.44–1.00)
GFR, Est AFR Am: 53 mL/min — ABNORMAL LOW (ref >60)
GFR, Estimated: 45 mL/min — ABNORMAL LOW (ref >60)
Glucose, Bld: 96 mg/dL (ref 70–99)
Potassium: 4.4 mmol/L (ref 3.5–5.1)
Sodium: 141 mmol/L (ref 135–145)
Total Bilirubin: 0.3 mg/dL (ref 0.3–1.2)
Total Protein: 6.1 g/dL — ABNORMAL LOW (ref 6.5–8.1)

## 2019-06-12 LAB — CBC WITH DIFFERENTIAL (CANCER CENTER ONLY)
Abs Immature Granulocytes: 0.02 10*3/uL (ref 0.00–0.07)
Basophils Absolute: 0 10*3/uL (ref 0.0–0.1)
Basophils Relative: 0 %
Eosinophils Absolute: 0.1 10*3/uL (ref 0.0–0.5)
Eosinophils Relative: 2 %
HCT: 33.9 % — ABNORMAL LOW (ref 36.0–46.0)
Hemoglobin: 11 g/dL — ABNORMAL LOW (ref 12.0–15.0)
Immature Granulocytes: 0 %
Lymphocytes Relative: 32 %
Lymphs Abs: 1.8 10*3/uL (ref 0.7–4.0)
MCH: 31.7 pg (ref 26.0–34.0)
MCHC: 32.4 g/dL (ref 30.0–36.0)
MCV: 97.7 fL (ref 80.0–100.0)
Monocytes Absolute: 0.6 10*3/uL (ref 0.1–1.0)
Monocytes Relative: 11 %
Neutro Abs: 3.1 10*3/uL (ref 1.7–7.7)
Neutrophils Relative %: 55 %
Platelet Count: 122 10*3/uL — ABNORMAL LOW (ref 150–400)
RBC: 3.47 MIL/uL — ABNORMAL LOW (ref 3.87–5.11)
RDW: 12.2 % (ref 11.5–15.5)
WBC Count: 5.7 10*3/uL (ref 4.0–10.5)
nRBC: 0 % (ref 0.0–0.2)

## 2019-06-13 ENCOUNTER — Telehealth: Payer: Self-pay | Admitting: Oncology

## 2019-06-13 NOTE — Telephone Encounter (Signed)
Scheduled appts per 6/1 los. Pt confirmed appt date and time.

## 2019-07-12 ENCOUNTER — Other Ambulatory Visit: Payer: Self-pay | Admitting: Oncology

## 2020-04-09 IMAGING — MG DIGITAL DIAGNOSTIC BILATERAL MAMMOGRAM WITH TOMO AND CAD
9 series · 9 of 25 positions shown · non-contrast
Comparison: Previous exam(s).

CLINICAL DATA: 78-year-old female presenting for routine annual
surveillance status post left breast lumpectomy in 1497.

EXAM:
DIGITAL DIAGNOSTIC BILATERAL MAMMOGRAM WITH CAD AND TOMO

[L MLO]
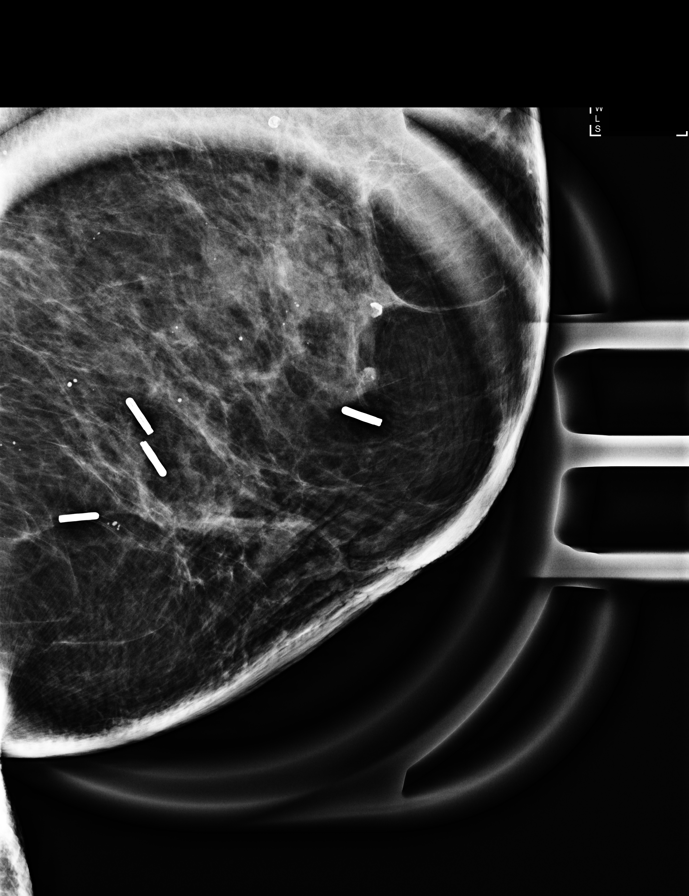

[L CC synth-2D]
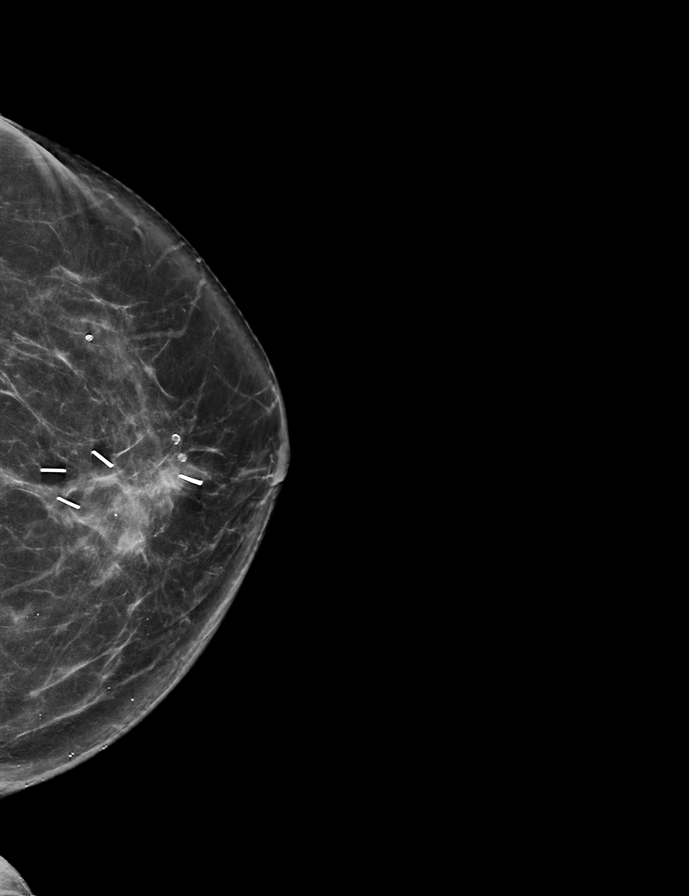

[R CC synth-2D]
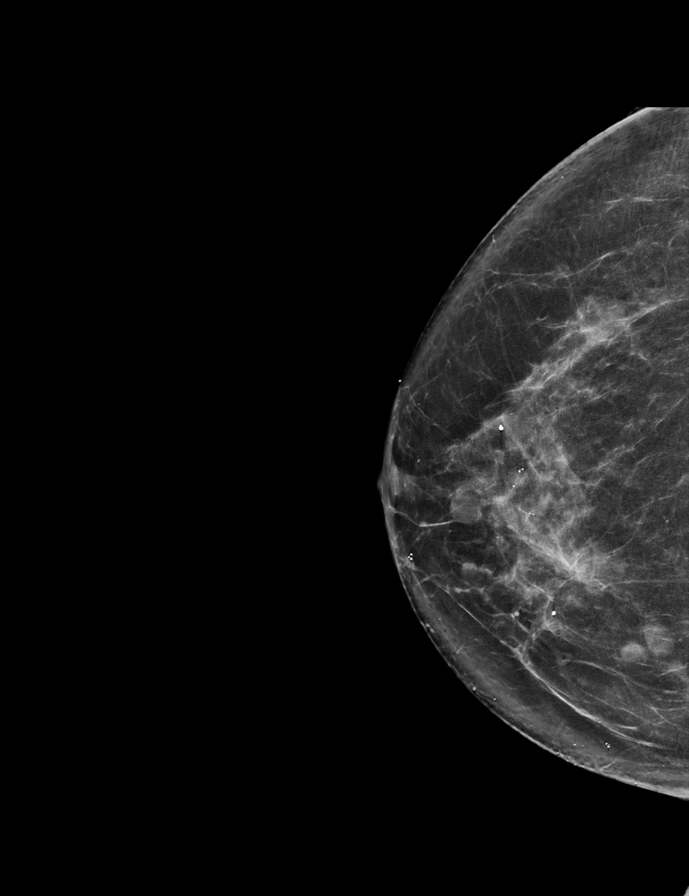

[R MLO synth-2D]
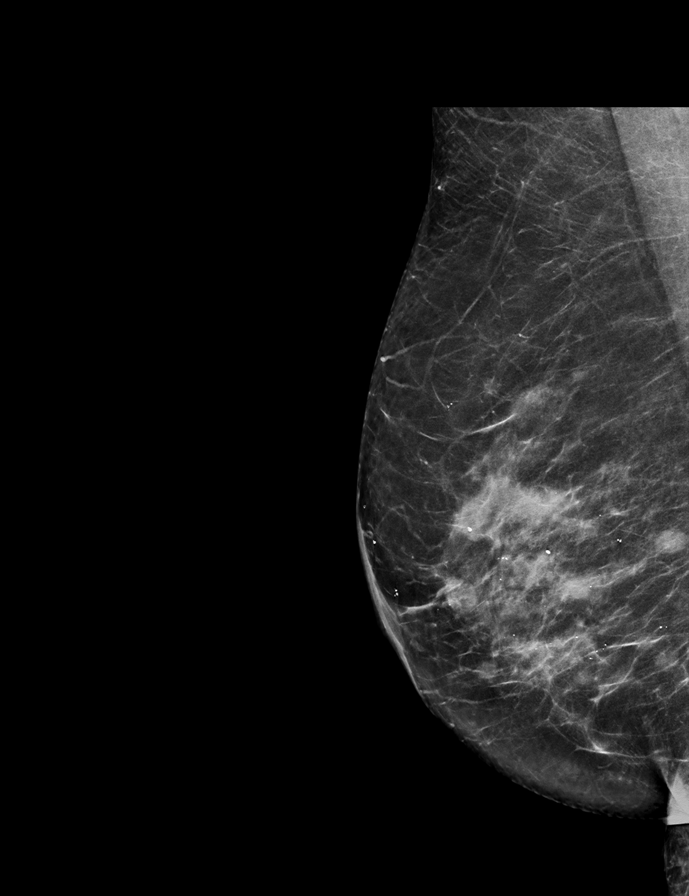

[L MLO synth-2D]
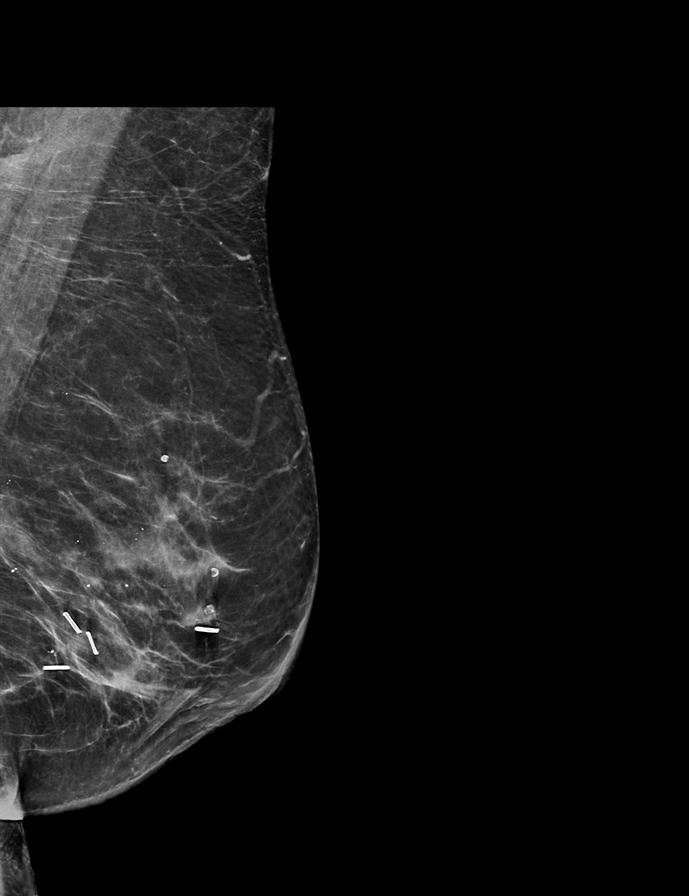

[R MLO tomo · tomo slice 35/69.0]
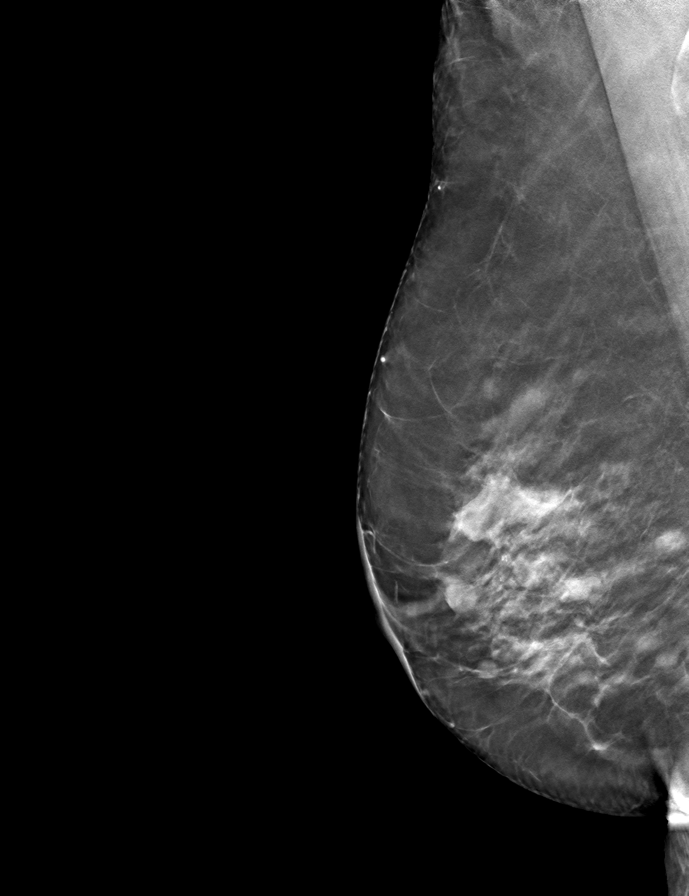

[L MLO tomo · tomo slice 33/65.0]
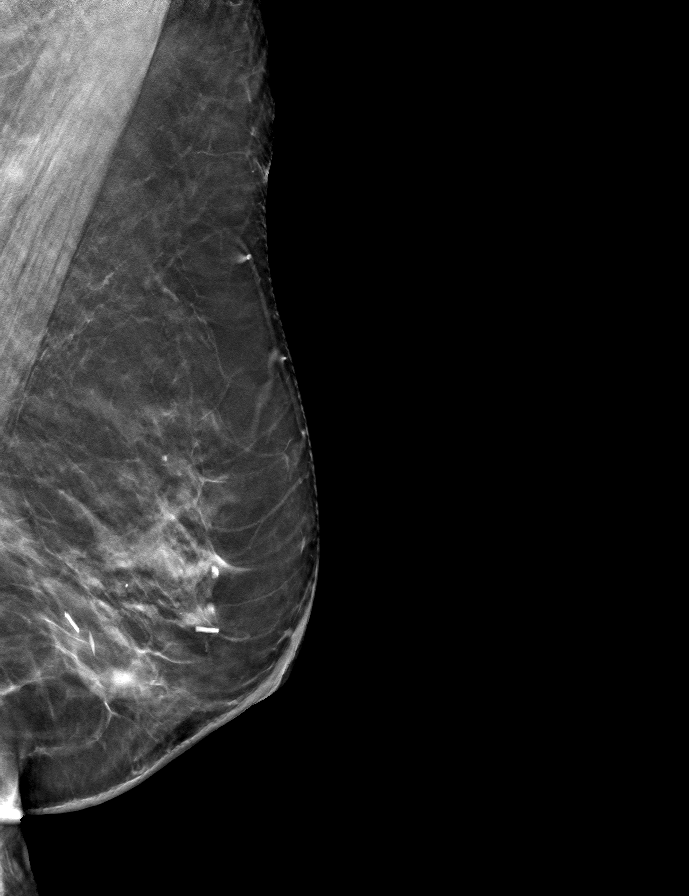

[R CC tomo · tomo slice 34/67.0]
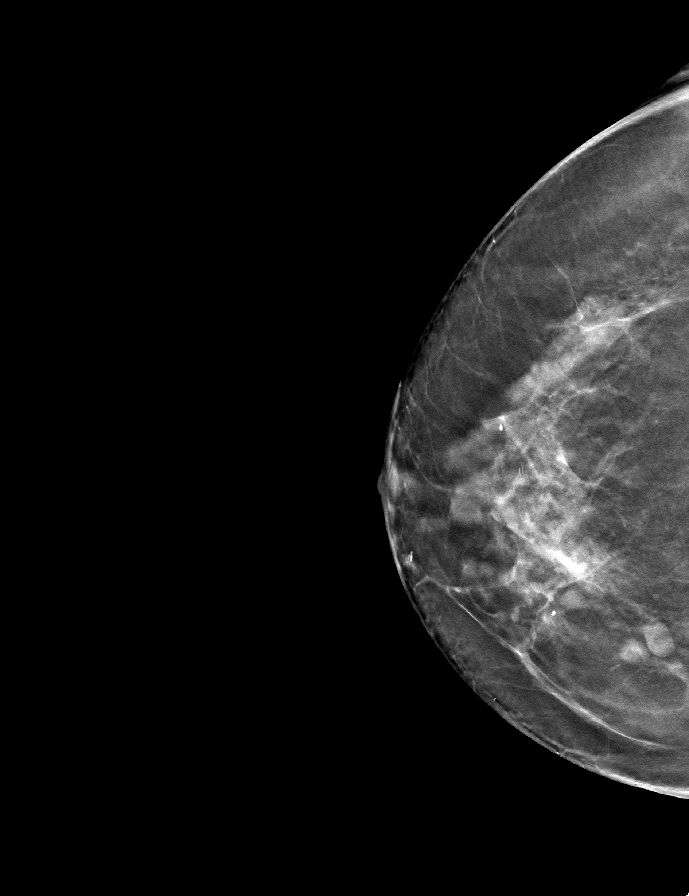

[L CC tomo · tomo slice 33/65.0]
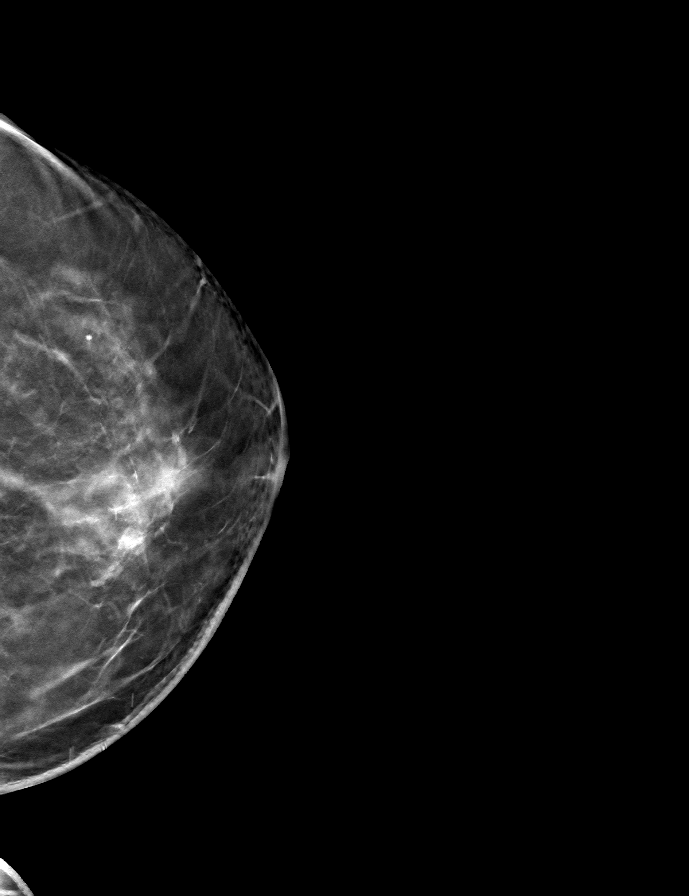

[9 of 25 positions shown; findings below may reference images not displayed]

ACR Breast Density Category c: The breast tissue is heterogeneously
dense, which may obscure small masses.
FINDINGS: The left breast lumpectomy site is stable. No suspicious
calcifications, masses or areas of distortion are seen in the
bilateral breasts.

Mammographic images were processed with CAD.
IMPRESSION: Stable left breast lumpectomy site. No mammographic evidence of
malignancy in the bilateral breasts.

RECOMMENDATION:
Diagnostic mammogram is suggested in 1 year. (Code:[id number])

I have discussed the findings and recommendations with the patient.
Results were also provided in writing at the conclusion of the
visit. If applicable, a reminder letter will be sent to the patient
regarding the next appointment.

BI-RADS CATEGORY  2: Benign.

## 2020-04-25 ENCOUNTER — Telehealth: Payer: Self-pay | Admitting: Oncology

## 2020-04-25 ENCOUNTER — Other Ambulatory Visit: Payer: Self-pay | Admitting: Oncology

## 2020-04-25 NOTE — Telephone Encounter (Signed)
R/s appts per 4/15 sch msg. Pt aware.

## 2020-06-12 ENCOUNTER — Other Ambulatory Visit: Payer: Medicare PPO

## 2020-06-12 ENCOUNTER — Ambulatory Visit: Payer: Medicare PPO | Admitting: Oncology

## 2020-06-25 ENCOUNTER — Other Ambulatory Visit: Payer: Self-pay

## 2020-06-25 DIAGNOSIS — C50312 Malignant neoplasm of lower-inner quadrant of left female breast: Secondary | ICD-10-CM

## 2020-06-26 ENCOUNTER — Other Ambulatory Visit: Payer: Self-pay

## 2020-06-26 ENCOUNTER — Telehealth: Payer: Self-pay

## 2020-06-26 ENCOUNTER — Encounter: Payer: Self-pay | Admitting: Adult Health

## 2020-06-26 ENCOUNTER — Inpatient Hospital Stay (HOSPITAL_BASED_OUTPATIENT_CLINIC_OR_DEPARTMENT_OTHER): Payer: Medicare PPO | Admitting: Adult Health

## 2020-06-26 ENCOUNTER — Inpatient Hospital Stay: Payer: Medicare PPO | Attending: Adult Health

## 2020-06-26 VITALS — BP 132/68 | HR 64 | Temp 97.3°F | Resp 16 | Ht 64.0 in | Wt 146.8 lb

## 2020-06-26 DIAGNOSIS — Z803 Family history of malignant neoplasm of breast: Secondary | ICD-10-CM | POA: Insufficient documentation

## 2020-06-26 DIAGNOSIS — Z170 Estrogen receptor positive status [ER+]: Secondary | ICD-10-CM

## 2020-06-26 DIAGNOSIS — Z800 Family history of malignant neoplasm of digestive organs: Secondary | ICD-10-CM | POA: Diagnosis not present

## 2020-06-26 DIAGNOSIS — Z8041 Family history of malignant neoplasm of ovary: Secondary | ICD-10-CM | POA: Insufficient documentation

## 2020-06-26 DIAGNOSIS — Z9049 Acquired absence of other specified parts of digestive tract: Secondary | ICD-10-CM | POA: Diagnosis not present

## 2020-06-26 DIAGNOSIS — C50312 Malignant neoplasm of lower-inner quadrant of left female breast: Secondary | ICD-10-CM | POA: Insufficient documentation

## 2020-06-26 DIAGNOSIS — Z79810 Long term (current) use of selective estrogen receptor modulators (SERMs): Secondary | ICD-10-CM | POA: Diagnosis not present

## 2020-06-26 DIAGNOSIS — Z923 Personal history of irradiation: Secondary | ICD-10-CM | POA: Diagnosis not present

## 2020-06-26 DIAGNOSIS — Z8249 Family history of ischemic heart disease and other diseases of the circulatory system: Secondary | ICD-10-CM | POA: Insufficient documentation

## 2020-06-26 DIAGNOSIS — Z79899 Other long term (current) drug therapy: Secondary | ICD-10-CM | POA: Diagnosis not present

## 2020-06-26 LAB — CMP (CANCER CENTER ONLY)
ALT: 13 U/L (ref 0–44)
AST: 15 U/L (ref 15–41)
Albumin: 3.8 g/dL (ref 3.5–5.0)
Alkaline Phosphatase: 49 U/L (ref 38–126)
Anion gap: 7 (ref 5–15)
BUN: 15 mg/dL (ref 8–23)
CO2: 26 mmol/L (ref 22–32)
Calcium: 9.4 mg/dL (ref 8.9–10.3)
Chloride: 109 mmol/L (ref 98–111)
Creatinine: 1.05 mg/dL — ABNORMAL HIGH (ref 0.44–1.00)
GFR, Estimated: 54 mL/min — ABNORMAL LOW (ref >60)
Glucose, Bld: 92 mg/dL (ref 70–99)
Potassium: 4.5 mmol/L (ref 3.5–5.1)
Sodium: 142 mmol/L (ref 135–145)
Total Bilirubin: 0.3 mg/dL (ref 0.3–1.2)
Total Protein: 6.9 g/dL (ref 6.5–8.1)

## 2020-06-26 LAB — CBC WITH DIFFERENTIAL (CANCER CENTER ONLY)
Abs Immature Granulocytes: 0.04 10*3/uL (ref 0.00–0.07)
Basophils Absolute: 0 10*3/uL (ref 0.0–0.1)
Basophils Relative: 0 %
Eosinophils Absolute: 0.1 10*3/uL (ref 0.0–0.5)
Eosinophils Relative: 2 %
HCT: 37.1 % (ref 36.0–46.0)
Hemoglobin: 11.9 g/dL — ABNORMAL LOW (ref 12.0–15.0)
Immature Granulocytes: 1 %
Lymphocytes Relative: 30 %
Lymphs Abs: 2.2 10*3/uL (ref 0.7–4.0)
MCH: 31.4 pg (ref 26.0–34.0)
MCHC: 32.1 g/dL (ref 30.0–36.0)
MCV: 97.9 fL (ref 80.0–100.0)
Monocytes Absolute: 0.7 10*3/uL (ref 0.1–1.0)
Monocytes Relative: 10 %
Neutro Abs: 4.3 10*3/uL (ref 1.7–7.7)
Neutrophils Relative %: 57 %
Platelet Count: 135 10*3/uL — ABNORMAL LOW (ref 150–400)
RBC: 3.79 MIL/uL — ABNORMAL LOW (ref 3.87–5.11)
RDW: 12.1 % (ref 11.5–15.5)
WBC Count: 7.4 10*3/uL (ref 4.0–10.5)
nRBC: 0 % (ref 0.0–0.2)

## 2020-06-26 NOTE — Progress Notes (Signed)
Jeanne Thompson  Telephone:(336) [phone] Fax:(336) [phone]     ID: Jeanne Thompson DOB: 26-Jan-1940  MR#: [id number]  VZD#:[id number]  Patient Care Team: Raelene Bott, MD as PCP - General (Internal Medicine) Magrinat, Virgie Dad, MD as Consulting Physician (Oncology) Jovita Kussmaul, MD as Consulting Physician (General Surgery) Gery Pray, MD as Consulting Physician (Radiation Oncology) Abbe Amsterdam, MD as Referring Physician (Surgery) Delice Bison Charlestine Massed, NP as Nurse Practitioner (Hematology and Oncology) Marcene Brawn, MD as Consulting Physician (Cardiology) Scot Dock, NP OTHER MD:  CHIEF COMPLAINT: Estrogen receptor positive breast cancer  CURRENT TREATMENT: Tamoxifen   INTERVAL HISTORY: [name] returns today for follow-up of her estrogen receptor positive breast cancer.   She continues on anti estrogen therapy with Tamoxifen.  She is tolerating this well.    She underwent bilateral diagnostic mammography with tomography at [facility] in [place] on 06/10/2020.  This found the breast density to be category C.  There was no evidence of malignancy.   Jeanne Thompson is doing well, she remains active with her puppy who currently is at dog camp (training).  She also enjoys Social worker.    REVIEW OF SYSTEMS: Review of Systems  Constitutional:  Negative for appetite change, chills, fatigue, fever and unexpected weight change.  HENT:   Negative for hearing loss, lump/mass and trouble swallowing.   Eyes:  Negative for eye problems and icterus.  Respiratory:  Negative for chest tightness, cough and shortness of breath.   Cardiovascular:  Negative for chest pain, leg swelling and palpitations.  Gastrointestinal:  Negative for abdominal distention, abdominal pain, constipation, diarrhea, nausea and vomiting.  Endocrine: Negative for hot flashes.  Genitourinary:  Negative for difficulty urinating.   Musculoskeletal:  Negative for arthralgias.  Skin:  Negative for  itching and rash.  Neurological:  Negative for dizziness, extremity weakness, headaches and numbness.  Hematological:  Negative for adenopathy. Does not bruise/bleed easily.  Psychiatric/Behavioral:  Negative for depression. The patient is not nervous/anxious.     BREAST CANCER HISTORY: From the original intake note:   Jeanne Thompson had routine screening mammography 04/05/2016 showing a possible mass in the left breast. She was brought back for left diagnostic mammography and left breast ultrasonography 04/20/2016. The breast density was category C. There was a spiculated mass in the lower inner portion of the left breast. On physical exam this was not palpable. Targeted ultrasound did confirm an irregular mass at the 6:15 o'clock location of the left breast 3 cm from the nipple, measuring 1.7 cm maximally.  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  She continued on tamoxifen right through all her treatments.    PAST MEDICAL HISTORY: Past Medical History:  Diagnosis Date   Anxiety    Arthritis    low back   Asthma    Complication of anesthesia    Depression    Enlarged heart    GERD (gastroesophageal reflux disease)    High cholesterol    History of radiation therapy 07/06/16-08/20/16   left breast was treated to 50.4 Gy in 28 fractions, left axilla was treated to 45 Gy in 25 fractions, left breast was boosted an additional 10 gy in 5 fractions   Hypertension    Personal history of radiation therapy  PONV (postoperative nausea and vomiting)     PAST SURGICAL HISTORY: Past Surgical History:  Procedure Laterality Date   ABDOMINAL HYSTERECTOMY     left 1 ovary   APPENDECTOMY     BREAST LUMPECTOMY Left    5/18   BREAST  LUMPECTOMY WITH RADIOACTIVE SEED AND SENTINEL LYMPH NODE BIOPSY Left 05/27/2016   Procedure: BREAST LUMPECTOMY WITH RADIOACTIVE SEED AND SENTINEL LYMPH NODE BIOPSY;  Surgeon: Jovita Kussmaul, MD;  Location: [place];  Service: General;  Laterality: Left;   BREAST SURGERY Left    benign   CHOLECYSTECTOMY     EYE SURGERY Left    cataract   PARTIAL HYSTERECTOMY     THYROIDECTOMY, PARTIAL     TONSILECTOMY/ADENOIDECTOMY WITH MYRINGOTOMY     TONSILLECTOMY      FAMILY HISTORY Family History  Problem Relation Age of Onset   Breast cancer Mother 57       again at age 46   Heart attack Father        tobacco abuse   Colon cancer Brother 55   Breast cancer Paternal Grandmother    Breast cancer Paternal Aunt    Breast cancer Maternal Grandmother        30s   Ovarian cancer Cousin   The patient's father died from myocardial infarction at age 66 in the setting of tobacco abuse. The patient's mother died at age 82. She had been diagnosed with breast cancer at age 41 and again at age 13. The patient had one brother, no sisters. The brother had colon cancer diagnosed at age 62. A maternal grandmother was diagnosed with breast cancer in her 47s. A paternal grandmother was diagnosed with breast cancer as well as well as 2 paternal aunts and in addition one paternal cousin was diagnosed with ovarian cancer. The patient herself has been tested for the BRCA mutation in was negative   GYNECOLOGIC HISTORY:  No LMP recorded. Patient has had a hysterectomy. Menarche age 53, first live birth age 89, she is [place] P2. She underwent hysterectomy with unilateral salpingo-oophorectomy age 37. She did not use hormone replacement. She did use oral contraceptives remotely for about 4 years.   SOCIAL HISTORY:  She worked as a Pharmacist, hospital, chiefly in reading, and still works part-time. She is divorced, lives alone with her dog . Son         Jeanne Thompson lives in [place] and works in Press photographer. Son Jeanne Thompson lives in [place] and is vice Radio producer and service. The patient has 4 grandchildren biologically and 2 step grandchildren. She is a [place]    ADVANCED DIRECTIVES: She has the advanced directive papers but has not yet completed or notarize them, as of the 05/05/2016 visit; she tells me (06/12/2019) she intends to name both her sons as joint healthcare power of attorney's  HEALTH MAINTENANCE: Social History   Tobacco Use   Smoking status: Never   Smokeless tobacco: Never  Substance Use Topics   Alcohol use: Yes    Comment: social   Drug use: No     Colonoscopy:  PAP:  Bone density:   No Known Allergies  Current Outpatient Medications  Medication Sig Dispense Refill   albuterol (PROVENTIL HFA;VENTOLIN HFA) 108 (90 Base) MCG/ACT inhaler Inhale 2 puffs into the lungs every 4 (four) hours as needed for wheezing or shortness of breath.     atorvastatin (LIPITOR) 10 MG tablet Take 10 mg by mouth daily.     budesonide (  PULMICORT) 180 MCG/ACT inhaler Inhale into the lungs 2 (two) times daily.     clobetasol ointment (TEMOVATE) 1.70 % Apply 1 application topically 2 (two) times daily.     esomeprazole (NEXIUM) 40 MG capsule Take 40 mg by mouth daily at 12 noon.     sertraline (ZOLOFT) 100 MG tablet Take 100 mg by mouth daily.     tamoxifen (NOLVADEX) 20 MG tablet TAKE 1 TABLET BY MOUTH EVERY DAY 90 tablet 3   No current facility-administered medications for this visit.    OBJECTIVE: White woman in no acute distress Vitals:   06/26/20 1145  BP: 132/68  Pulse: 64  Resp: 16  Temp: (!) 97.3 F (36.3 C)  SpO2: 99%     Body mass index is 25.2 kg/m.    ECOG FS:1 - Symptomatic but completely ambulatory  GENERAL: Patient is a well appearing female in no acute distress HEENT:  Sclerae anicteric.  Oropharynx clear and moist. No ulcerations or evidence of oropharyngeal candidiasis. Neck is supple.  NODES:  No cervical, supraclavicular, or axillary lymphadenopathy  palpated.  BREAST EXAM: left breast s/p lumpectomy and radiation, no sign of local recurrence, right breast benign LUNGS:  Clear to auscultation bilaterally.  No wheezes or rhonchi. HEART:  Regular rate and rhythm. No murmur appreciated. ABDOMEN:  Soft, nontender.  Positive, normoactive bowel sounds. No organomegaly palpated. MSK:  No focal spinal tenderness to palpation. Full range of motion bilaterally in the upper extremities. EXTREMITIES:  No peripheral edema.   SKIN:  Clear with no obvious rashes or skin changes. No nail dyscrasia. NEURO:  Nonfocal. Well oriented.  Appropriate affect.    LAB RESULTS:  CMP     Component Value Date/Time   NA 142 06/26/2020 1123   NA 142 09/07/2016 1258   K 4.5 06/26/2020 1123   K 4.1 09/07/2016 1258   CL 109 06/26/2020 1123   CO2 26 06/26/2020 1123   CO2 27 09/07/2016 1258   GLUCOSE 92 06/26/2020 1123   GLUCOSE 95 09/07/2016 1258   BUN 15 06/26/2020 1123   BUN 16.0 09/07/2016 1258   CREATININE 1.05 (H) 06/26/2020 1123   CREATININE 0.9 09/07/2016 1258   CALCIUM 9.4 06/26/2020 1123   CALCIUM 9.2 09/07/2016 1258   PROT 6.9 06/26/2020 1123   PROT 6.4 09/07/2016 1258   ALBUMIN 3.8 06/26/2020 1123   ALBUMIN 3.3 (L) 09/07/2016 1258   AST 15 06/26/2020 1123   AST 13 09/07/2016 1258   ALT 13 06/26/2020 1123   ALT 17 09/07/2016 1258   ALKPHOS 49 06/26/2020 1123   ALKPHOS 60 09/07/2016 1258   BILITOT 0.3 06/26/2020 1123   BILITOT 0.24 09/07/2016 1258   GFRNONAA 54 (L) 06/26/2020 1123   GFRAA 53 (L) 06/12/2019 1115    No results found for: TOTALPROTELP, ALBUMINELP, A1GS, A2GS, BETS, BETA2SER, GAMS, MSPIKE, SPEI  No results found for: KPAFRELGTCHN, LAMBDASER, KAPLAMBRATIO  Lab Results  Component Value Date   WBC 7.4 06/26/2020   NEUTROABS 4.3 06/26/2020   HGB 11.9 (L) 06/26/2020   HCT 37.1 06/26/2020   MCV 97.9 06/26/2020   PLT 135 (L) 06/26/2020      Chemistry      Component Value Date/Time   NA 142 06/26/2020 1123   NA 142  09/07/2016 1258   K 4.5 06/26/2020 1123   K 4.1 09/07/2016 1258   CL 109 06/26/2020 1123   CO2 26 06/26/2020 1123   CO2 27 09/07/2016 1258   BUN 15 06/26/2020 1123  BUN 16.0 09/07/2016 1258   CREATININE 1.05 (H) 06/26/2020 1123   CREATININE 0.9 09/07/2016 1258      Component Value Date/Time   CALCIUM 9.4 06/26/2020 1123   CALCIUM 9.2 09/07/2016 1258   ALKPHOS 49 06/26/2020 1123   ALKPHOS 60 09/07/2016 1258   AST 15 06/26/2020 1123   AST 13 09/07/2016 1258   ALT 13 06/26/2020 1123   ALT 17 09/07/2016 1258   BILITOT 0.3 06/26/2020 1123   BILITOT 0.24 09/07/2016 1258       No results found for: LABCA2  No components found for: UXNATF573  No results for input(s): INR in the last 168 hours.  Urinalysis No results found for: COLORURINE, APPEARANCEUR, LABSPEC, PHURINE, GLUCOSEU, HGBUR, BILIRUBINUR, KETONESUR, PROTEINUR, UROBILINOGEN, NITRITE, LEUKOCYTESUR   STUDIES:  No results found.   ELIGIBLE FOR AVAILABLE RESEARCH PROTOCOL: no  ASSESSMENT: 80 y.o. BRCA negative [facility] woman status post left breast lower inner quadrant biopsy 04/29/2016 for a clinical pT1c pN0, tage IA s invasive ductal carcinoma,  grade 2, estrogen and progesterone receptor positive, HER-2 nonamplified, with an MIB-1 of 10%.   (1) tamoxifen started neoadjuvantly 05/05/2016  (2) additional genetics testing pending  (3) Status post left lumpectomy and sentinel lymph node sampling 05/27/2016 for a pT2 pN1, stage IIA invasive ductal carcinoma, grade 1, with negative margins.  (4) Mammaprint read as low risk, indicating no significant chemotherapy benefit.  (5) adjuvant radiation 07/06/2016 - 08/20/2016  Site/dose:    1) The left breast was treated to 50.4 Gy in 28 fractions of 1.8 Gy. 2) The left axilla was treated to 45 Gy in 25 fractions of 1.8 Gy. 3) The left breast was boosted an additional 10 Gy in 5 fractions of 2 Gy, for a total dose of 60.4 Gy to the left breast.  (6) continuing on  tamoxifen to complete 5 years    PLAN: Dashonda is here today for f/u of her stage IIA ER/PR positive breast cancer.  She has no clinical or radiographic sign of breast cancer recurrence.  She continues on Tamoxifen daily will good tolerance and the plan is for her to continue this through five years of therapy.  She and I discussed in detail the small amount of benefit available to some women in extending antiestrogen therapy from 5-10 years.  After our discussion, Terrianne noted that she is comfortable discontinuing after 5 years, since she will have received the majority of the benefit at that time.    Laela and I talked about health maintenance.  I recommended she stay up to date with her PCP visits, and continue with healthy diet and exercise as she has been.    We will see Corby back in 1 year for f/u.  She knows to call for any questions that may arise between now and her next appointment.  We are happy to see her sooner if needed.  Total encounter time 32 minutes.* in lab review, chart review, face to face visit time, order entry, and documentation of the encounter.  Wilber Bihari, NP 06/26/20 12:23 PM Medical Oncology and Hematology [facility] [place], [place] [address] Tel. [phone]    Fax. [phone]     *Total Encounter Time as defined by the Centers for Medicare and Medicaid Services includes, in addition to the face-to-face time of a patient visit (documented in the note above) non-face-to-face time: obtaining and reviewing outside history, ordering and reviewing medications, tests or procedures, care coordination (communications with other  health care professionals or caregivers) and documentation in the medical record.

## 2020-07-07 ENCOUNTER — Other Ambulatory Visit: Payer: Self-pay | Admitting: Oncology

## 2020-08-01 NOTE — Telephone Encounter (Signed)
error 

## 2021-05-05 ENCOUNTER — Telehealth: Payer: Self-pay | Admitting: Adult Health

## 2021-05-05 NOTE — Telephone Encounter (Signed)
Rescheduled appointment per provider template. Patient is aware of the changes made to her upcoming appointment 

## 2021-06-26 ENCOUNTER — Telehealth: Payer: Self-pay | Admitting: Adult Health

## 2021-06-26 ENCOUNTER — Other Ambulatory Visit: Payer: Self-pay

## 2021-06-26 ENCOUNTER — Inpatient Hospital Stay: Payer: Medicare PPO | Attending: Adult Health | Admitting: Adult Health

## 2021-06-26 ENCOUNTER — Encounter: Payer: Self-pay | Admitting: Adult Health

## 2021-06-26 VITALS — BP 126/70 | HR 86 | Temp 97.8°F | Resp 18 | Ht 64.0 in | Wt 151.1 lb

## 2021-06-26 DIAGNOSIS — Z79899 Other long term (current) drug therapy: Secondary | ICD-10-CM | POA: Insufficient documentation

## 2021-06-26 DIAGNOSIS — Z803 Family history of malignant neoplasm of breast: Secondary | ICD-10-CM | POA: Insufficient documentation

## 2021-06-26 DIAGNOSIS — C50312 Malignant neoplasm of lower-inner quadrant of left female breast: Secondary | ICD-10-CM | POA: Insufficient documentation

## 2021-06-26 DIAGNOSIS — Z923 Personal history of irradiation: Secondary | ICD-10-CM | POA: Insufficient documentation

## 2021-06-26 DIAGNOSIS — Z8041 Family history of malignant neoplasm of ovary: Secondary | ICD-10-CM | POA: Diagnosis not present

## 2021-06-26 DIAGNOSIS — Z9049 Acquired absence of other specified parts of digestive tract: Secondary | ICD-10-CM | POA: Insufficient documentation

## 2021-06-26 DIAGNOSIS — Z8249 Family history of ischemic heart disease and other diseases of the circulatory system: Secondary | ICD-10-CM | POA: Insufficient documentation

## 2021-06-26 DIAGNOSIS — Z800 Family history of malignant neoplasm of digestive organs: Secondary | ICD-10-CM | POA: Insufficient documentation

## 2021-06-26 DIAGNOSIS — Z170 Estrogen receptor positive status [ER+]: Secondary | ICD-10-CM | POA: Insufficient documentation

## 2021-06-26 DIAGNOSIS — M549 Dorsalgia, unspecified: Secondary | ICD-10-CM | POA: Diagnosis not present

## 2021-06-26 NOTE — Assessment & Plan Note (Addendum)
Jeanne Thompson is a 81 year old woman with stage Ia estrogen progesterone positive breast cancer status postlumpectomy, radiation and completing 5 years of tamoxifen therapy.  [name] has no clinical or radiographic sign of breast cancer recurrence.  She continues to follow with the [facility] about her back issues which are not related to cancer.  Her initial treatment plan with Dr. Jana Hakim included 5 years of adjuvant antiestrogen therapy with tamoxifen.  She is cleared to complete this today.  I have discontinued it from her list.  She says she understands that some women may take it longer but she is ready to be completed.  I cannot find records of her mammogram at [facility].  She is going to look into whether she had 1 this year or not.  She thinks that she did.  She would like to follow-up with Dr. Chryl Heck for her surveillance and she will return in 1 year for follow-up with her.

## 2021-06-26 NOTE — Progress Notes (Signed)
[facility] Follow up:    Jeanne Bott, MD [address] [place] [address]   DIAGNOSIS:  Cancer Staging  Malignant neoplasm of lower-inner quadrant of left breast in female, estrogen receptor positive ([place]) Staging form: Breast, AJCC 8th Edition - Clinical stage from 05/05/2016: Stage IA (cT1c, cN0, cM0, G2, ER+, PR+, HER2-) - Unsigned Histologic grading system: 3 grade system Laterality: Left Staged by: Pathologist and managing physician Stage used in treatment planning: Yes National guidelines used in treatment planning: Yes Type of national guideline used in treatment planning: NCCN - Pathologic: Stage IA (pT2, pN1a, cM0, G1, ER+, PR+, HER2-) - Unsigned Multigene prognostic tests performed: MammaPrint Histologic grading system: 3 grade system   SUMMARY OF ONCOLOGIC HISTORY: Oncology History  Malignant neoplasm of lower-inner quadrant of left breast in female, estrogen receptor positive ([place])  04/29/2016 Initial Biopsy   Left breast lower inner quadrant biopsy: IDC, grade 2, ER+(100%), PR+(100%), Ki-67 10%, HER-2 negative (ratio1.28).    05/04/2016 Initial Diagnosis   Malignant neoplasm of lower-inner quadrant of left breast in female, estrogen receptor positive ([place])   05/05/2016 -  Anti-estrogen oral therapy   Tamoxifen daily   05/27/2016 Surgery   Left lumpectomy and SLNB: IDC, grade 1, 2.4 cm, DCIS, perineural invasion, 1/2 SLN +, margins neg, T2, N1a.  MAMMAPRINT Low risk   07/06/2016 - 08/20/2016 Radiation Therapy   1) The left breast was treated to 50.4 Gy in 28 fractions of 1.8 Gy.  2) The left axilla was treated to 45 Gy in 25 fractions of 1.8 Gy.  3) The left breast was boosted an additional 10 Gy in 5 fractions of 2 Gy, for a total dose of 60.4 Gy to the left breast.       CURRENT THERAPY:  INTERVAL HISTORY: Jeanne Thompson 81 y.o. female returns for follow-up of her history of estrogen positive breast cancer her most  recent mammogram completed on 06/10/2020 was normal showing no evidence of malignancy and breast density C.   She is taking tamoxifen daily and is ready to finish her 5 years as planned with Dr. Jana Hakim.  She is undergoing PT for her back issues and MRI on 12/24/2020 showing some degeneration.  She is seeing [facility] for this.       Patient Active Problem List   Diagnosis Date Noted   Malignant neoplasm of lower-inner quadrant of left breast in female, estrogen receptor positive ([place]) 05/04/2016    has No Known Allergies.  MEDICAL HISTORY: Past Medical History:  Diagnosis Date   Anxiety    Arthritis    low back   Asthma    Complication of anesthesia    Depression    Enlarged heart    GERD (gastroesophageal reflux disease)    High cholesterol    History of radiation therapy 07/06/16-08/20/16   left breast was treated to 50.4 Gy in 28 fractions, left axilla was treated to 45 Gy in 25 fractions, left breast was boosted an additional 10 gy in 5 fractions   Hypertension    Personal history of radiation therapy    PONV (postoperative nausea and vomiting)     SURGICAL HISTORY: Past Surgical History:  Procedure Laterality Date   ABDOMINAL HYSTERECTOMY     left 1 ovary   APPENDECTOMY     BREAST LUMPECTOMY Left    5/18   BREAST LUMPECTOMY WITH RADIOACTIVE SEED AND SENTINEL LYMPH NODE BIOPSY Left 05/27/2016   Procedure: BREAST  LUMPECTOMY WITH RADIOACTIVE SEED AND SENTINEL LYMPH NODE BIOPSY;  Surgeon: Jovita Kussmaul, MD;  Location: [facility];  Service: General;  Laterality: Left;   BREAST SURGERY Left    benign   CHOLECYSTECTOMY     EYE SURGERY Left    cataract   PARTIAL HYSTERECTOMY     THYROIDECTOMY, PARTIAL     TONSILECTOMY/ADENOIDECTOMY WITH MYRINGOTOMY     TONSILLECTOMY      SOCIAL HISTORY: Social History   Socioeconomic History   Marital status: Single    Spouse name: Not on file   Number of children: Not on file   Years of education: Not  on file   Highest education level: Not on file  Occupational History   Not on file  Tobacco Use   Smoking status: Never   Smokeless tobacco: Never  Substance and Sexual Activity   Alcohol use: Yes    Comment: social   Drug use: No   Sexual activity: Not on file  Other Topics Concern   Not on file  Social History Narrative   Not on file   Social Determinants of Health   Financial Resource Strain: Not on file  Food Insecurity: Not on file  Transportation Needs: Not on file  Physical Activity: Not on file  Stress: Not on file  Social Connections: Not on file  Intimate Partner Violence: Not on file    FAMILY HISTORY: Family History  Problem Relation Age of Onset   Breast cancer Mother 20       again at age 52   Heart attack Father        tobacco abuse   Colon cancer Brother 33   Breast cancer Paternal Grandmother    Breast cancer Paternal Aunt    Breast cancer Maternal Grandmother        27s   Ovarian cancer Cousin     Review of Systems  Constitutional:  Negative for appetite change, chills, fatigue, fever and unexpected weight change.  HENT:   Negative for hearing loss, lump/mass and trouble swallowing.   Eyes:  Negative for eye problems and icterus.  Respiratory:  Negative for chest tightness, cough and shortness of breath.   Cardiovascular:  Negative for chest pain, leg swelling and palpitations.  Gastrointestinal:  Negative for abdominal distention, abdominal pain, constipation, diarrhea, nausea and vomiting.  Endocrine: Negative for hot flashes.  Genitourinary:  Negative for difficulty urinating.   Musculoskeletal:  Positive for back pain (Per interval history). Negative for arthralgias.  Skin:  Negative for itching and rash.  Neurological:  Negative for dizziness, extremity weakness, headaches and numbness.  Hematological:  Negative for adenopathy. Does not bruise/bleed easily.  Psychiatric/Behavioral:  Negative for depression. The patient is not  nervous/anxious.       PHYSICAL EXAMINATION  ECOG PERFORMANCE STATUS: 1 - Symptomatic but completely ambulatory  Vitals:   06/26/21 1113  BP: 126/70  Pulse: 86  Resp: 18  Temp: 97.8 F (36.6 C)  SpO2: 96%    Physical Exam Constitutional:      General: She is not in acute distress.    Appearance: Normal appearance. She is not toxic-appearing.  HENT:     Head: Normocephalic and atraumatic.  Eyes:     General: No scleral icterus. Cardiovascular:     Rate and Rhythm: Normal rate and regular rhythm.     Pulses: Normal pulses.     Heart sounds: Normal heart sounds.  Pulmonary:     Effort: Pulmonary effort is normal.  Breath sounds: Normal breath sounds.  Chest:     Comments: Right breast is benign left breast status postlumpectomy and radiation no sign of local recurrence. Abdominal:     General: Abdomen is flat. Bowel sounds are normal. There is no distension.     Palpations: Abdomen is soft.     Tenderness: There is no abdominal tenderness.  Musculoskeletal:        General: No swelling.     Cervical back: Neck supple.  Lymphadenopathy:     Cervical: No cervical adenopathy.  Skin:    General: Skin is warm and dry.     Findings: No rash.  Neurological:     General: No focal deficit present.     Mental Status: She is alert.  Psychiatric:        Mood and Affect: Mood normal.        Behavior: Behavior normal.     LABORATORY DATA:  None for this visit  ASSESSMENT and THERAPY PLAN:   Malignant neoplasm of lower-inner quadrant of left breast in female, estrogen receptor positive ([place]) Rogelio is a 81 year old woman with stage Ia estrogen progesterone positive breast cancer status postlumpectomy, radiation and completing 5 years of tamoxifen therapy.  [name] has no clinical or radiographic sign of breast cancer recurrence.  She continues to follow with the [facility] about her back issues which are not related to cancer.  Her initial treatment plan with Dr.  Jana Hakim included 5 years of adjuvant antiestrogen therapy with tamoxifen.  She is cleared to complete this today.  I have discontinued it from her list.  She says she understands that some women may take it longer but she is ready to be completed.  I cannot find records of her mammogram at [facility].  She is going to look into whether she had 1 this year or not.  She thinks that she did.  She would like to follow-up with Dr. Chryl Heck for her surveillance and she will return in 1 year for follow-up with her.    All questions were answered. The patient knows to call the clinic with any problems, questions or concerns. We can certainly see the patient much sooner if necessary.  Total encounter time:20 minutes*in face-to-face visit time, chart review, lab review, care coordination, order entry, and documentation of the encounter time.    Wilber Bihari, NP 06/26/21 12:10 PM Medical Oncology and Hematology [facility] [place], [place] [address] Tel. [phone]    Fax. [phone]  *Total Encounter Time as defined by the Centers for Medicare and Medicaid Services includes, in addition to the face-to-face time of a patient visit (documented in the note above) non-face-to-face time: obtaining and reviewing outside history, ordering and reviewing medications, tests or procedures, care coordination (communications with other health care professionals or caregivers) and documentation in the medical record.

## 2021-06-26 NOTE — Telephone Encounter (Signed)
Scheduled appointment per 6/16 los. Patient is aware.

## 2022-02-02 ENCOUNTER — Telehealth: Payer: Self-pay | Admitting: *Deleted

## 2022-02-02 NOTE — Telephone Encounter (Signed)
Called to f/u with questions on anti-estrogen cream wanting to be prescribed by pt urologist to help with bladder issues as well as recurrent UTI's that pt has been having. Advised daughter in law that someone will f/u. Daughter in law verbalized understanding

## 2022-02-02 NOTE — Telephone Encounter (Signed)
Received call from pt requesting advice from NP if okay to proceed with estrogen cream prescribed by PCP to alleviate vaginal dryness.  Per NP okay to proceed with a small amount up to 3 times a week.  Pt educated and verbalized understanding.

## 2022-06-28 ENCOUNTER — Inpatient Hospital Stay: Payer: Medicare PPO | Attending: Hematology and Oncology | Admitting: Hematology and Oncology
